# Patient Record
Sex: Female | Born: 1955 | Race: White | Hispanic: No | Smoking: Never smoker
Health system: Southern US, Community
[De-identification: ages and names within clinical notes are randomized; demographics above are authoritative.]

---

## 2016-10-11 DIAGNOSIS — Z23 Encounter for immunization: Secondary | ICD-10-CM | POA: Diagnosis not present

## 2016-11-28 DIAGNOSIS — Z1212 Encounter for screening for malignant neoplasm of rectum: Secondary | ICD-10-CM | POA: Diagnosis not present

## 2016-11-28 DIAGNOSIS — Z1231 Encounter for screening mammogram for malignant neoplasm of breast: Secondary | ICD-10-CM | POA: Diagnosis not present

## 2016-11-28 DIAGNOSIS — Z01419 Encounter for gynecological examination (general) (routine) without abnormal findings: Secondary | ICD-10-CM | POA: Diagnosis not present

## 2017-03-24 DIAGNOSIS — Z13820 Encounter for screening for osteoporosis: Secondary | ICD-10-CM | POA: Diagnosis not present

## 2017-09-11 DIAGNOSIS — Z23 Encounter for immunization: Secondary | ICD-10-CM | POA: Diagnosis not present

## 2018-08-16 ENCOUNTER — Other Ambulatory Visit (HOSPITAL_COMMUNITY)
Admission: RE | Admit: 2018-08-16 | Discharge: 2018-08-16 | Disposition: A | Discharge status: Home / Self Care | Payer: BLUE CROSS/BLUE SHIELD | Source: Ambulatory Visit | Attending: Obstetrics and Gynecology | Admitting: Obstetrics and Gynecology

## 2018-08-16 ENCOUNTER — Encounter: Payer: Self-pay | Admitting: Obstetrics and Gynecology

## 2018-08-16 ENCOUNTER — Ambulatory Visit (INDEPENDENT_AMBULATORY_CARE_PROVIDER_SITE_OTHER): Payer: BLUE CROSS/BLUE SHIELD | Admitting: Obstetrics and Gynecology

## 2018-08-16 ENCOUNTER — Other Ambulatory Visit: Payer: Self-pay

## 2018-08-16 VITALS — BP 112/78 | HR 61 | Ht 64.5 in | Wt 124.0 lb

## 2018-08-16 DIAGNOSIS — Z124 Encounter for screening for malignant neoplasm of cervix: Secondary | ICD-10-CM | POA: Diagnosis not present

## 2018-08-16 DIAGNOSIS — Z01419 Encounter for gynecological examination (general) (routine) without abnormal findings: Secondary | ICD-10-CM

## 2018-08-16 NOTE — Patient Instructions (Addendum)
EXERCISE AND DIET:  We recommended that you start or continue a regular exercise program for good health. Regular exercise means any activity that makes your heart beat faster and makes you sweat.  We recommend exercising at least 30 minutes per day at least 3 days a week, preferably 4 or 5.  We also recommend a diet low in fat and sugar.  Inactivity, poor dietary choices and obesity can cause diabetes, heart attack, stroke, and kidney damage, among others.    ALCOHOL AND SMOKING:  Women should limit their alcohol intake to no more than 7 drinks/beers/glasses of wine (combined, not each!) per week. Moderation of alcohol intake to this level decreases your risk of breast cancer and liver damage. And of course, no recreational drugs are part of a healthy lifestyle.  And absolutely no smoking or even second hand smoke. Most people know smoking can cause heart and lung diseases, but did you know it also contributes to weakening of your bones? Aging of your skin?  Yellowing of your teeth and nails?  CALCIUM AND VITAMIN D:  Adequate intake of calcium and Vitamin D are recommended.  The recommendations for exact amounts of these supplements seem to change often, but generally speaking 600 mg of calcium (either carbonate or citrate) and 800 units of Vitamin D per day seems prudent. Certain women may benefit from higher intake of Vitamin D.  If you are among these women, your doctor will have told you during your visit.    PAP SMEARS:  Pap smears, to check for cervical cancer or precancers,  have traditionally been done yearly, although recent scientific advances have shown that most women can have pap smears less often.  However, every woman still should have a physical exam from her gynecologist every year. It will include a breast check, inspection of the vulva and vagina to check for abnormal growths or skin changes, a visual exam of the cervix, and then an exam to evaluate the size and shape of the uterus and  ovaries.  And after 62 years of age, a rectal exam is indicated to check for rectal cancers. We will also provide age appropriate advice regarding health maintenance, like when you should have certain vaccines, screening for sexually transmitted diseases, bone density testing, colonoscopy, mammograms, etc.   MAMMOGRAMS:  All women over 40 years old should have a yearly mammogram. Many facilities now offer a "3D" mammogram, which may cost around $50 extra out of pocket. If possible,  we recommend you accept the option to have the 3D mammogram performed.  It both reduces the number of women who will be called back for extra views which then turn out to be normal, and it is better than the routine mammogram at detecting truly abnormal areas.    COLONOSCOPY:  Colonoscopy to screen for colon cancer is recommended for all women at age 50.  We know, you hate the idea of the prep.  We agree, BUT, having colon cancer and not knowing it is worse!!  Colon cancer so often starts as a polyp that can be seen and removed at colonscopy, which can quite literally save your life!  And if your first colonoscopy is normal and you have no family history of colon cancer, most women don't have to have it again for 10 years.  Once every ten years, you can do something that may end up saving your life, right?  We will be happy to help you get it scheduled when you are ready.    Be sure to check your insurance coverage so you understand how much it will cost.  It may be covered as a preventative service at no cost, but you should check your particular policy.      Breast Self-Awareness Breast self-awareness means being familiar with how your breasts look and feel. It involves checking your breasts regularly and reporting any changes to your health care provider. Practicing breast self-awareness is important. A change in your breasts can be a sign of a serious medical problem. Being familiar with how your breasts look and feel allows  you to find any problems early, when treatment is more likely to be successful. All women should practice breast self-awareness, including women who have had breast implants. How to do a breast self-exam One way to learn what is normal for your breasts and whether your breasts are changing is to do a breast self-exam. To do a breast self-exam: Look for Changes  1. Remove all the clothing above your waist. 2. Stand in front of a mirror in a room with good lighting. 3. Put your hands on your hips. 4. Push your hands firmly downward. 5. Compare your breasts in the mirror. Look for differences between them (asymmetry), such as: ? Differences in shape. ? Differences in size. ? Puckers, dips, and bumps in one breast and not the other. 6. Look at each breast for changes in your skin, such as: ? Redness. ? Scaly areas. 7. Look for changes in your nipples, such as: ? Discharge. ? Bleeding. ? Dimpling. ? Redness. ? A change in position. Feel for Changes  Carefully feel your breasts for lumps and changes. It is best to do this while lying on your back on the floor and again while sitting or standing in the shower or tub with soapy water on your skin. Feel each breast in the following way:  Place the arm on the side of the breast you are examining above your head.  Feel your breast with the other hand.  Start in the nipple area and make  inch (2 cm) overlapping circles to feel your breast. Use the pads of your three middle fingers to do this. Apply light pressure, then medium pressure, then firm pressure. The light pressure will allow you to feel the tissue closest to the skin. The medium pressure will allow you to feel the tissue that is a little deeper. The firm pressure will allow you to feel the tissue close to the ribs.  Continue the overlapping circles, moving downward over the breast until you feel your ribs below your breast.  Move one finger-width toward the center of the body.  Continue to use the  inch (2 cm) overlapping circles to feel your breast as you move slowly up toward your collarbone.  Continue the up and down exam using all three pressures until you reach your armpit.  Write Down What You Find  Write down what is normal for each breast and any changes that you find. Keep a written record with breast changes or normal findings for each breast. By writing this information down, you do not need to depend only on memory for size, tenderness, or location. Write down where you are in your menstrual cycle, if you are still menstruating. If you are having trouble noticing differences in your breasts, do not get discouraged. With time you will become more familiar with the variations in your breasts and more comfortable with the exam. How often should I examine my breasts? Examine   your breasts every month. If you are breastfeeding, the best time to examine your breasts is after a feeding or after using a breast pump. If you menstruate, the best time to examine your breasts is 5-7 days after your period is over. During your period, your breasts are lumpier, and it may be more difficult to notice changes. When should I see my health care provider? See your health care provider if you notice:  A change in shape or size of your breasts or nipples.  A change in the skin of your breast or nipples, such as a reddened or scaly area.  Unusual discharge from your nipples.  A lump or thick area that was not there before.  Pain in your breasts.  Anything that concerns you.  This information is not intended to replace advice given to you by your health care provider. Make sure you discuss any questions you have with your health care provider. Document Released: 11/28/2005 Document Revised: 05/05/2016 Document Reviewed: 10/18/2015 Elsevier Interactive Patient Education  2018 Elsevier Inc.   Mammogram Facilities  Yearly screening mammograms are recommended for women  beginning at age 40. For a routine screening mammogram, you may schedule the appointment and have it done at the location of your choice.  Please ask the facility to send the results to our office. (fax [phone]) Location options include:  [facility] [address], [address] [place], [place] [address] [phone]  [facility] [address], [address] [place], [place] [address] [phone]  

## 2018-08-16 NOTE — Progress Notes (Signed)
62 y.o. [id number] MarriedCaucasianF here for annual exam.   Period Cycle (Days): (post menopausal). No bleeding. Sexually active, no pain.   No LMP recorded.          Sexually active: Yes.    The current method of family planning is post menopausal status.    Exercising: Yes.    walking Smoker:  no  Health Maintenance: Pap:  2017 History of abnormal Pap:  no MMG:  2 years ago Colonoscopy:  4 years ago, f/u in 10 years BMD:   2 years ago, normal  TDaP:  2015 Gardasil: never   reports that she has never smoked. She has never used smokeless tobacco. She reports that she drinks alcohol. She reports that she does not use drugs. Drinks 1 glass of wine a week. Works as a Charity fundraiser for [employer].  Moved here from [place], [place] in 12/19.  3 kids, one daugther here. Son is in [place]. Other son in [place]. 4 grand children (17-5).   History reviewed. No pertinent past medical history.  History reviewed. No pertinent surgical history.  No current outpatient medications on file.   No current facility-administered medications for this visit.     History reviewed. No pertinent family history. 8 siblings, all healthy other than arthritis.   Review of Systems  Constitutional: Negative.   HENT: Negative.   Eyes: Negative.   Respiratory: Negative.   Cardiovascular: Negative.   Gastrointestinal: Negative.   Endocrine: Negative.   Genitourinary: Negative.   Musculoskeletal: Negative.   Skin: Negative.   Allergic/Immunologic: Negative.   Neurological: Negative.   Hematological: Negative.   Psychiatric/Behavioral: Negative.   All other systems reviewed and are negative.   Exam:   BP 112/78   Pulse 61   Ht 5' 4.5" (1.638 m)   Wt 124 lb (56.2 kg)   BMI 20.96 kg/m   Weight change: @WEIGHTCHANGE @ Height:   Height: 5' 4.5" (163.8 cm)  Ht Readings from Last 3 Encounters:  08/16/18 5' 4.5" (1.638 m)    General appearance: alert, cooperative and appears stated age Head: Normocephalic, without obvious  abnormality, atraumatic Neck: no adenopathy, supple, symmetrical, trachea midline and thyroid normal to inspection and palpation Lungs: clear to auscultation bilaterally Cardiovascular: regular rate and rhythm Breasts: normal appearance, no masses or tenderness Abdomen: soft, non-tender; non distended,  no masses,  no organomegaly Extremities: extremities normal, atraumatic, no cyanosis or edema Skin: Skin color, texture, turgor normal. No rashes or lesions Lymph nodes: Cervical, supraclavicular, and axillary nodes normal. No abnormal inguinal nodes palpated Neurologic: Grossly normal   Pelvic: External genitalia:  no lesions              Urethra:  normal appearing urethra with no masses, tenderness or lesions              Bartholins and Skenes: normal                 Vagina: atrophic appearing vagina with normal color and discharge, no lesions              Cervix: no lesions               Bimanual Exam:  Uterus:  normal size, contour, position, consistency, mobility, non-tender              Adnexa: no mass, fullness, tenderness               Rectovaginal: Confirms  Anus:  normal sphincter tone, no lesions  Chaperone was present for exam.  A:  Well Woman with normal exam  P:   Labs at work  Mammogram # given  Discussed breast self exam  Discussed calcium and vit D intake  Pap with hpv

## 2018-08-16 NOTE — Addendum Note (Signed)
Addended by: Tobi Bastos on: 08/16/2018 02:29 PM   Modules accepted: Orders

## 2018-08-17 LAB — CYTOLOGY - PAP
Diagnosis: NEGATIVE
HPV: NOT DETECTED

## 2018-08-23 ENCOUNTER — Other Ambulatory Visit: Payer: Self-pay | Admitting: Obstetrics and Gynecology

## 2018-08-23 DIAGNOSIS — Z1231 Encounter for screening mammogram for malignant neoplasm of breast: Secondary | ICD-10-CM

## 2018-08-24 ENCOUNTER — Ambulatory Visit
Admission: RE | Admit: 2018-08-24 | Discharge: 2018-08-24 | Disposition: A | Discharge status: Home / Self Care | Payer: BLUE CROSS/BLUE SHIELD | Source: Ambulatory Visit | Attending: Obstetrics and Gynecology | Admitting: Obstetrics and Gynecology

## 2018-08-24 DIAGNOSIS — Z1231 Encounter for screening mammogram for malignant neoplasm of breast: Secondary | ICD-10-CM | POA: Diagnosis not present

## 2018-08-31 DIAGNOSIS — Z23 Encounter for immunization: Secondary | ICD-10-CM | POA: Diagnosis not present

## 2019-08-13 NOTE — Progress Notes (Signed)
63 y.o. G3P2103 Married White or Caucasian Not Hispanic or Latino female here for annual exam.   No vaginal bleeding. No dyspareunia.     No LMP recorded. Patient is postmenopausal.          Sexually active: Yes.    The current method of family planning is post menopausal status.    Exercising: Yes.    running Smoker:  no  Health Maintenance: Pap:  08/16/2018 WNL NEG HPV History of abnormal Pap:  no MMG:  02/27/2018 Birads 1 negative Colonoscopy:  2015 WNL per patient BMD:   2017 WNL per patient TDaP:  Thinks due Gardasil: Never    reports that she has never smoked. She has never used smokeless tobacco. She reports current alcohol use. She reports that she does not use drugs. 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  [place] in 12[place]18.  3 kids, one daugther here. Son is in [place]. Other son in [place]. 4 grand children (18-6). Daughter is pregnant!  History reviewed. No pertinent past medical history.  History reviewed. No pertinent surgical history.  No current outpatient medications on file.   No current facility-administered medications for this visit.     History reviewed. No pertinent family history.  Review of Systems  Constitutional: Negative.   HENT: Negative.   Eyes: Negative.   Respiratory: Negative.   Cardiovascular: Negative.   Gastrointestinal: Negative.   Endocrine: Negative.   Genitourinary: Negative.   Musculoskeletal: Negative.   Skin: Negative.   Allergic/Immunologic: Negative.   Neurological: Negative.   Hematological: Negative.   Psychiatric/Behavioral: Negative.     Exam:   BP 106/70 (BP Location: Right Arm, Patient Position: Sitting, Cuff Size: Normal)   Pulse 64   Temp (!) 97.2 F (36.2 C) (Skin)   Ht 5' 4.57" (1.64 m)   Wt 122 lb 12.8 oz (55.7 kg)   BMI 20.71 kg/m   Weight change: @WEIGHTCHANGE @ Height:   Height: 5' 4.57" (164 cm)  Ht Readings from Last 3 Encounters:  08/21/19 5' 4.57" (1.64 m)  08/16/18 5' 4.5" (1.638 m)    General  appearance: alert, cooperative and appears stated age Head: Normocephalic, without obvious abnormality, atraumatic Neck: no adenopathy, supple, symmetrical, trachea midline and thyroid normal to inspection and palpation Lungs: clear to auscultation bilaterally Cardiovascular: regular rate and rhythm Breasts: normal appearance, no masses or tenderness Abdomen: soft, non-tender; non distended,  no masses,  no organomegaly Extremities: extremities normal, atraumatic, no cyanosis or edema Skin: Skin color, texture, turgor normal. No rashes or lesions Lymph nodes: Cervical, supraclavicular, and axillary nodes normal. No abnormal inguinal nodes palpated Neurologic: Grossly normal   Pelvic: External genitalia:  no lesions              Urethra:  normal appearing urethra with no masses, tenderness or lesions              Bartholins and Skenes: normal                 Vagina: atrophic appearing vagina with normal color and discharge, no lesions              Cervix: no lesions               Bimanual Exam:  Uterus:  normal size, contour, position, consistency, mobility, non-tender              Adnexa: no mass, fullness, tenderness  Rectovaginal: Confirms               Anus:  normal sphincter tone, no lesions  Chaperone was present for exam.  A:  Well Woman with normal exam  P:   No pap this year  Mammogram due  Colonoscopy and DEXA UTD  Discussed breast self exam  Discussed calcium and vit D intake  Screening labs today  TDAP today

## 2019-08-16 ENCOUNTER — Other Ambulatory Visit: Payer: Self-pay

## 2019-08-18 DIAGNOSIS — Z23 Encounter for immunization: Secondary | ICD-10-CM | POA: Diagnosis not present

## 2019-08-21 ENCOUNTER — Other Ambulatory Visit: Payer: Self-pay | Admitting: Obstetrics and Gynecology

## 2019-08-21 ENCOUNTER — Other Ambulatory Visit: Payer: Self-pay

## 2019-08-21 ENCOUNTER — Encounter: Payer: Self-pay | Admitting: Obstetrics and Gynecology

## 2019-08-21 ENCOUNTER — Ambulatory Visit (INDEPENDENT_AMBULATORY_CARE_PROVIDER_SITE_OTHER): Payer: BC Managed Care – PPO | Admitting: Obstetrics and Gynecology

## 2019-08-21 VITALS — BP 106/70 | HR 64 | Temp 97.2°F | Ht 64.57 in | Wt 122.8 lb

## 2019-08-21 DIAGNOSIS — Z1231 Encounter for screening mammogram for malignant neoplasm of breast: Secondary | ICD-10-CM

## 2019-08-21 DIAGNOSIS — Z01419 Encounter for gynecological examination (general) (routine) without abnormal findings: Secondary | ICD-10-CM | POA: Diagnosis not present

## 2019-08-21 DIAGNOSIS — Z0000 Encounter for general adult medical examination without abnormal findings: Secondary | ICD-10-CM

## 2019-08-21 DIAGNOSIS — Z23 Encounter for immunization: Secondary | ICD-10-CM

## 2019-08-21 NOTE — Patient Instructions (Signed)
EXERCISE AND DIET:  We recommended that you start or continue a regular exercise program for good health. Regular exercise means any activity that makes your heart beat faster and makes you sweat.  We recommend exercising at least 30 minutes per day at least 3 days a week, preferably 4 or 5.  We also recommend a diet low in fat and sugar.  Inactivity, poor dietary choices and obesity can cause diabetes, heart attack, stroke, and kidney damage, among others.    ALCOHOL AND SMOKING:  Women should limit their alcohol intake to no more than 7 drinks/beers/glasses of wine (combined, not each!) per week. Moderation of alcohol intake to this level decreases your risk of breast cancer and liver damage. And of course, no recreational drugs are part of a healthy lifestyle.  And absolutely no smoking or even second hand smoke. Most people know smoking can cause heart and lung diseases, but did you know it also contributes to weakening of your bones? Aging of your skin?  Yellowing of your teeth and nails?  CALCIUM AND VITAMIN D:  Adequate intake of calcium and Vitamin D are recommended.  The recommendations for exact amounts of these supplements seem to change often, but generally speaking 1,200 mg of calcium (between diet and supplement) and 800 units of Vitamin D per day seems prudent. Certain women may benefit from higher intake of Vitamin D.  If you are among these women, your doctor will have told you during your visit.    PAP SMEARS:  Pap smears, to check for cervical cancer or precancers,  have traditionally been done yearly, although recent scientific advances have shown that most women can have pap smears less often.  However, every woman still should have a physical exam from her gynecologist every year. It will include a breast check, inspection of the vulva and vagina to check for abnormal growths or skin changes, a visual exam of the cervix, and then an exam to evaluate the size and shape of the uterus and  ovaries.  And after 63 years of age, a rectal exam is indicated to check for rectal cancers. We will also provide age appropriate advice regarding health maintenance, like when you should have certain vaccines, screening for sexually transmitted diseases, bone density testing, colonoscopy, mammograms, etc.   MAMMOGRAMS:  All women over 40 years old should have a yearly mammogram. Many facilities now offer a "3D" mammogram, which may cost around $50 extra out of pocket. If possible,  we recommend you accept the option to have the 3D mammogram performed.  It both reduces the number of women who will be called back for extra views which then turn out to be normal, and it is better than the routine mammogram at detecting truly abnormal areas.    COLON CANCER SCREENING: Now recommend starting at age 45. At this time colonoscopy is not covered for routine screening until 50. There are take home tests that can be done between 45-49.   COLONOSCOPY:  Colonoscopy to screen for colon cancer is recommended for all women at age 50.  We know, you hate the idea of the prep.  We agree, BUT, having colon cancer and not knowing it is worse!!  Colon cancer so often starts as a polyp that can be seen and removed at colonscopy, which can quite literally save your life!  And if your first colonoscopy is normal and you have no family history of colon cancer, most women don't have to have it again for   10 years.  Once every ten years, you can do something that may end up saving your life, right?  We will be happy to help you get it scheduled when you are ready.  Be sure to check your insurance coverage so you understand how much it will cost.  It may be covered as a preventative service at no cost, but you should check your particular policy.      Breast Self-Awareness Breast self-awareness means being familiar with how your breasts look and feel. It involves checking your breasts regularly and reporting any changes to your  health care provider. Practicing breast self-awareness is important. A change in your breasts can be a sign of a serious medical problem. Being familiar with how your breasts look and feel allows you to find any problems early, when treatment is more likely to be successful. All women should practice breast self-awareness, including women who have had breast implants. How to do a breast self-exam One way to learn what is normal for your breasts and whether your breasts are changing is to do a breast self-exam. To do a breast self-exam: Look for Changes  1. Remove all the clothing above your waist. 2. Stand in front of a mirror in a room with good lighting. 3. Put your hands on your hips. 4. Push your hands firmly downward. 5. Compare your breasts in the mirror. Look for differences between them (asymmetry), such as: ? Differences in shape. ? Differences in size. ? Puckers, dips, and bumps in one breast and not the other. 6. Look at each breast for changes in your skin, such as: ? Redness. ? Scaly areas. 7. Look for changes in your nipples, such as: ? Discharge. ? Bleeding. ? Dimpling. ? Redness. ? A change in position. Feel for Changes Carefully feel your breasts for lumps and changes. It is best to do this while lying on your back on the floor and again while sitting or standing in the shower or tub with soapy water on your skin. Feel each breast in the following way:  Place the arm on the side of the breast you are examining above your head.  Feel your breast with the other hand.  Start in the nipple area and make  inch (2 cm) overlapping circles to feel your breast. Use the pads of your three middle fingers to do this. Apply light pressure, then medium pressure, then firm pressure. The light pressure will allow you to feel the tissue closest to the skin. The medium pressure will allow you to feel the tissue that is a little deeper. The firm pressure will allow you to feel the tissue  close to the ribs.  Continue the overlapping circles, moving downward over the breast until you feel your ribs below your breast.  Move one finger-width toward the center of the body. Continue to use the  inch (2 cm) overlapping circles to feel your breast as you move slowly up toward your collarbone.  Continue the up and down exam using all three pressures until you reach your armpit.  Write Down What You Find  Write down what is normal for each breast and any changes that you find. Keep a written record with breast changes or normal findings for each breast. By writing this information down, you do not need to depend only on memory for size, tenderness, or location. Write down where you are in your menstrual cycle, if you are still menstruating. If you are having trouble noticing differences   in your breasts, do not get discouraged. With time you will become more familiar with the variations in your breasts and more comfortable with the exam. How often should I examine my breasts? Examine your breasts every month. If you are breastfeeding, the best time to examine your breasts is after a feeding or after using a breast pump. If you menstruate, the best time to examine your breasts is 5-7 days after your period is over. During your period, your breasts are lumpier, and it may be more difficult to notice changes. When should I see my health care provider? See your health care provider if you notice:  A change in shape or size of your breasts or nipples.  A change in the skin of your breast or nipples, such as a reddened or scaly area.  Unusual discharge from your nipples.  A lump or thick area that was not there before.  Pain in your breasts.  Anything that concerns you.  

## 2019-08-22 LAB — COMPREHENSIVE METABOLIC PANEL
ALT: 12 IU/L (ref 0–32)
AST: 18 IU/L (ref 0–40)
Albumin/Globulin Ratio: 2.2 (ref 1.2–2.2)
Albumin: 4.3 g/dL (ref 3.8–4.8)
Alkaline Phosphatase: 86 IU/L (ref 39–117)
BUN/Creatinine Ratio: 17 (ref 12–28)
BUN: 16 mg/dL (ref 8–27)
Bilirubin Total: 0.8 mg/dL (ref 0.0–1.2)
CO2: 28 mmol/L (ref 20–29)
Calcium: 9.6 mg/dL (ref 8.7–10.3)
Chloride: 99 mmol/L (ref 96–106)
Creatinine, Ser: 0.95 mg/dL (ref 0.57–1.00)
GFR calc Af Amer: 74 mL/min/{1.73_m2} (ref >59)
GFR calc non Af Amer: 64 mL/min/{1.73_m2} (ref >59)
Globulin, Total: 2 g/dL (ref 1.5–4.5)
Glucose: 95 mg/dL (ref 65–99)
Potassium: 5 mmol/L (ref 3.5–5.2)
Sodium: 141 mmol/L (ref 134–144)
Total Protein: 6.3 g/dL (ref 6.0–8.5)

## 2019-08-22 LAB — LIPID PANEL
Chol/HDL Ratio: 3.7 ratio (ref 0.0–4.4)
Cholesterol, Total: 230 mg/dL — ABNORMAL HIGH (ref 100–199)
HDL: 62 mg/dL (ref >39)
LDL Chol Calc (NIH): 149 mg/dL — ABNORMAL HIGH (ref 0–99)
Triglycerides: 107 mg/dL (ref 0–149)
VLDL Cholesterol Cal: 19 mg/dL (ref 5–40)

## 2019-08-22 LAB — CBC
Hematocrit: 44.8 % (ref 34.0–46.6)
Hemoglobin: 14 g/dL (ref 11.1–15.9)
MCH: 30.4 pg (ref 26.6–33.0)
MCHC: 31.3 g/dL — ABNORMAL LOW (ref 31.5–35.7)
MCV: 97 fL (ref 79–97)
Platelets: 265 10*3/uL (ref 150–450)
RBC: 4.6 x10E6/uL (ref 3.77–5.28)
RDW: 13 % (ref 11.7–15.4)
WBC: 4.4 10*3/uL (ref 3.4–10.8)

## 2019-10-03 ENCOUNTER — Ambulatory Visit
Admission: RE | Admit: 2019-10-03 | Discharge: 2019-10-03 | Disposition: A | Discharge status: Home / Self Care | Payer: BC Managed Care – PPO | Source: Ambulatory Visit | Attending: Obstetrics and Gynecology | Admitting: Obstetrics and Gynecology

## 2019-10-03 ENCOUNTER — Other Ambulatory Visit: Payer: Self-pay

## 2019-10-03 DIAGNOSIS — Z1231 Encounter for screening mammogram for malignant neoplasm of breast: Secondary | ICD-10-CM | POA: Diagnosis not present

## 2019-10-28 IMAGING — MG DIGITAL SCREENING BILATERAL MAMMOGRAM WITH CAD
4 series · 4 of 4 positions shown · non-contrast
Comparison: Previous exam(s).

CLINICAL DATA: Screening.

EXAM:
DIGITAL SCREENING BILATERAL MAMMOGRAM WITH CAD

[R MLO]
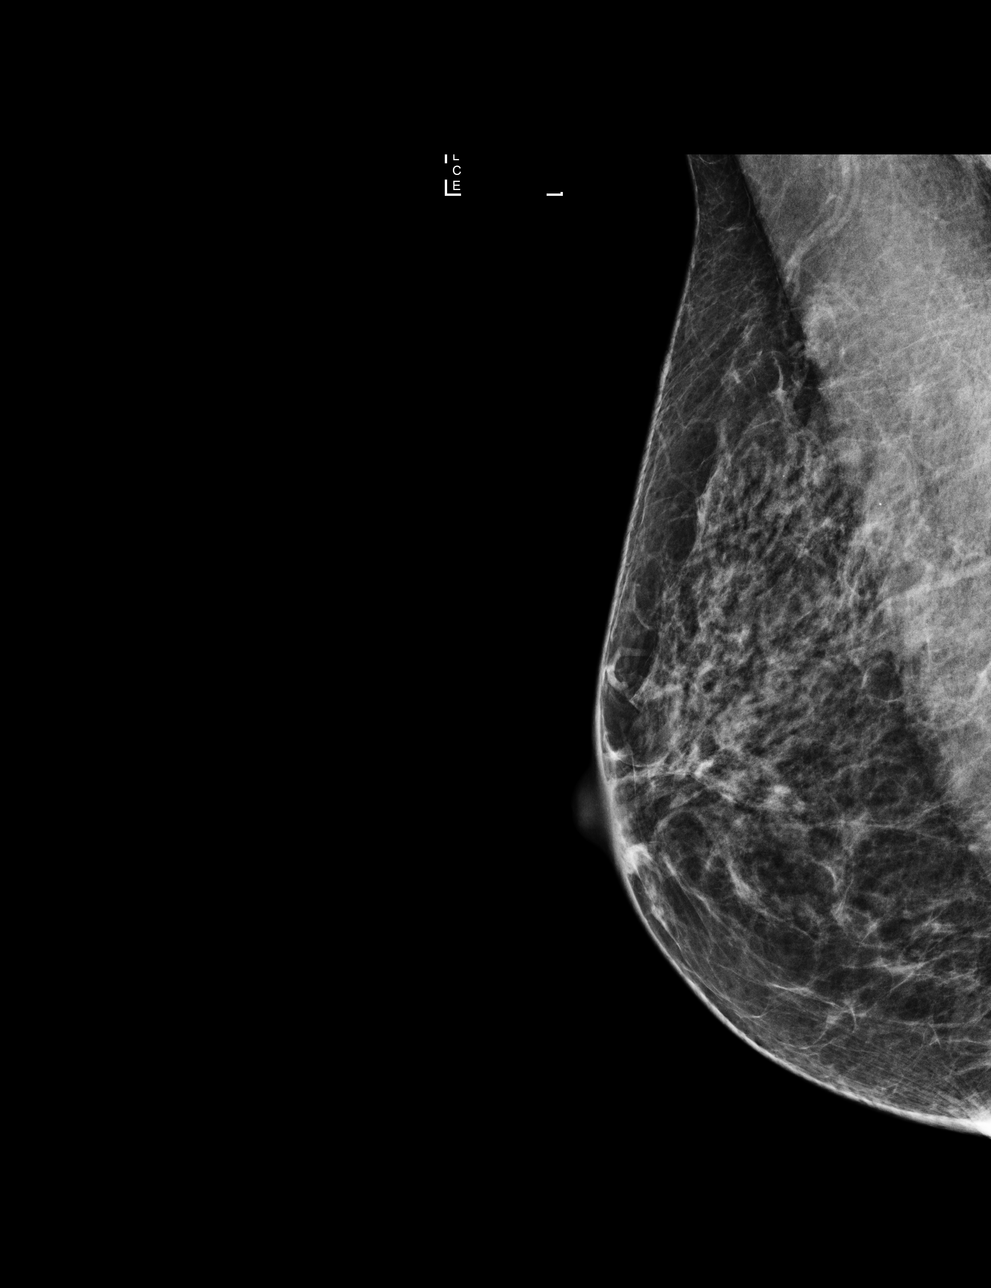

[R CC]
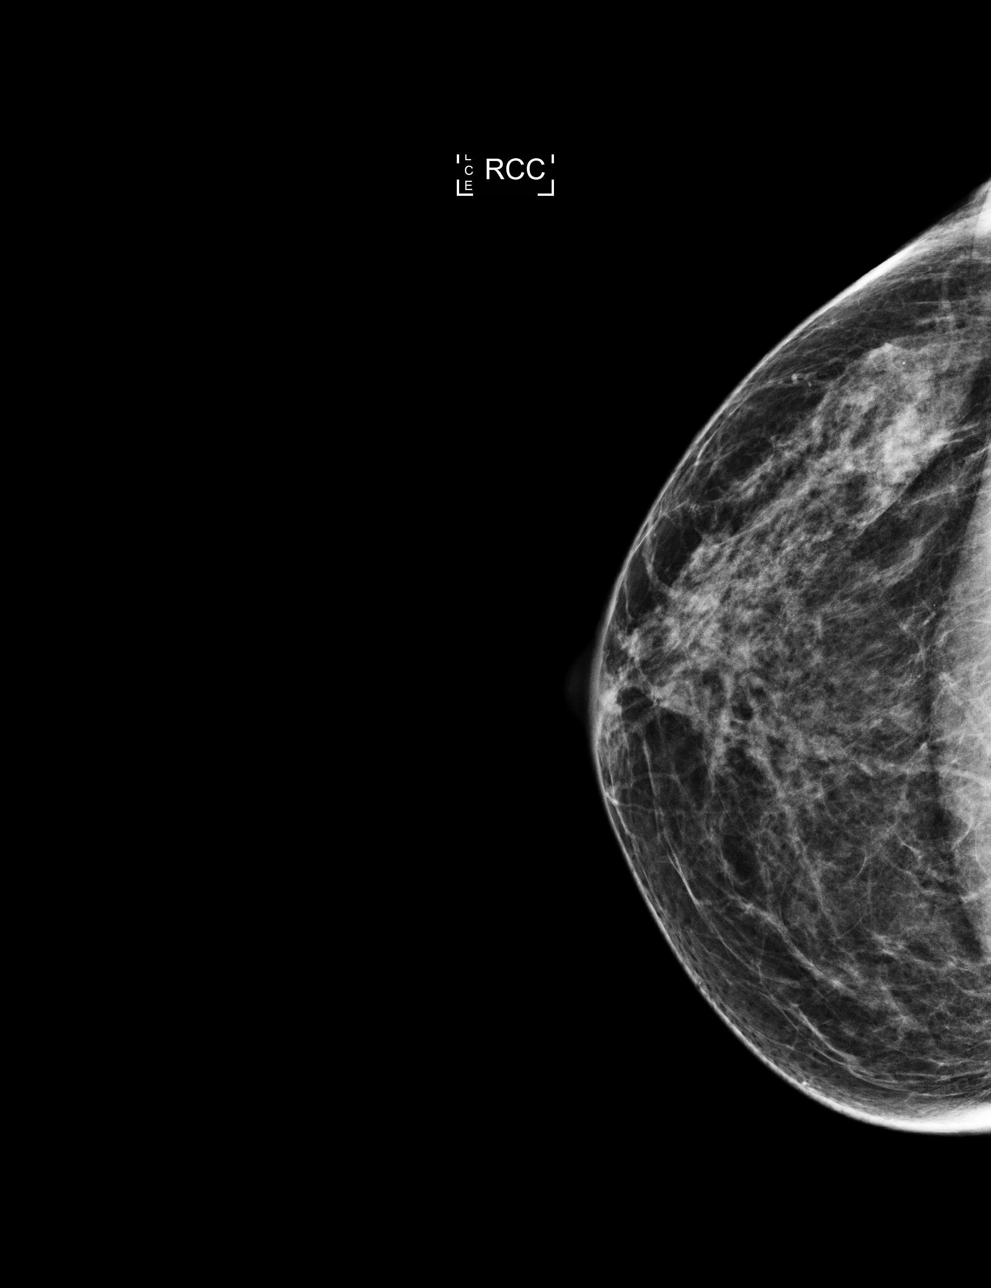

[L CC]
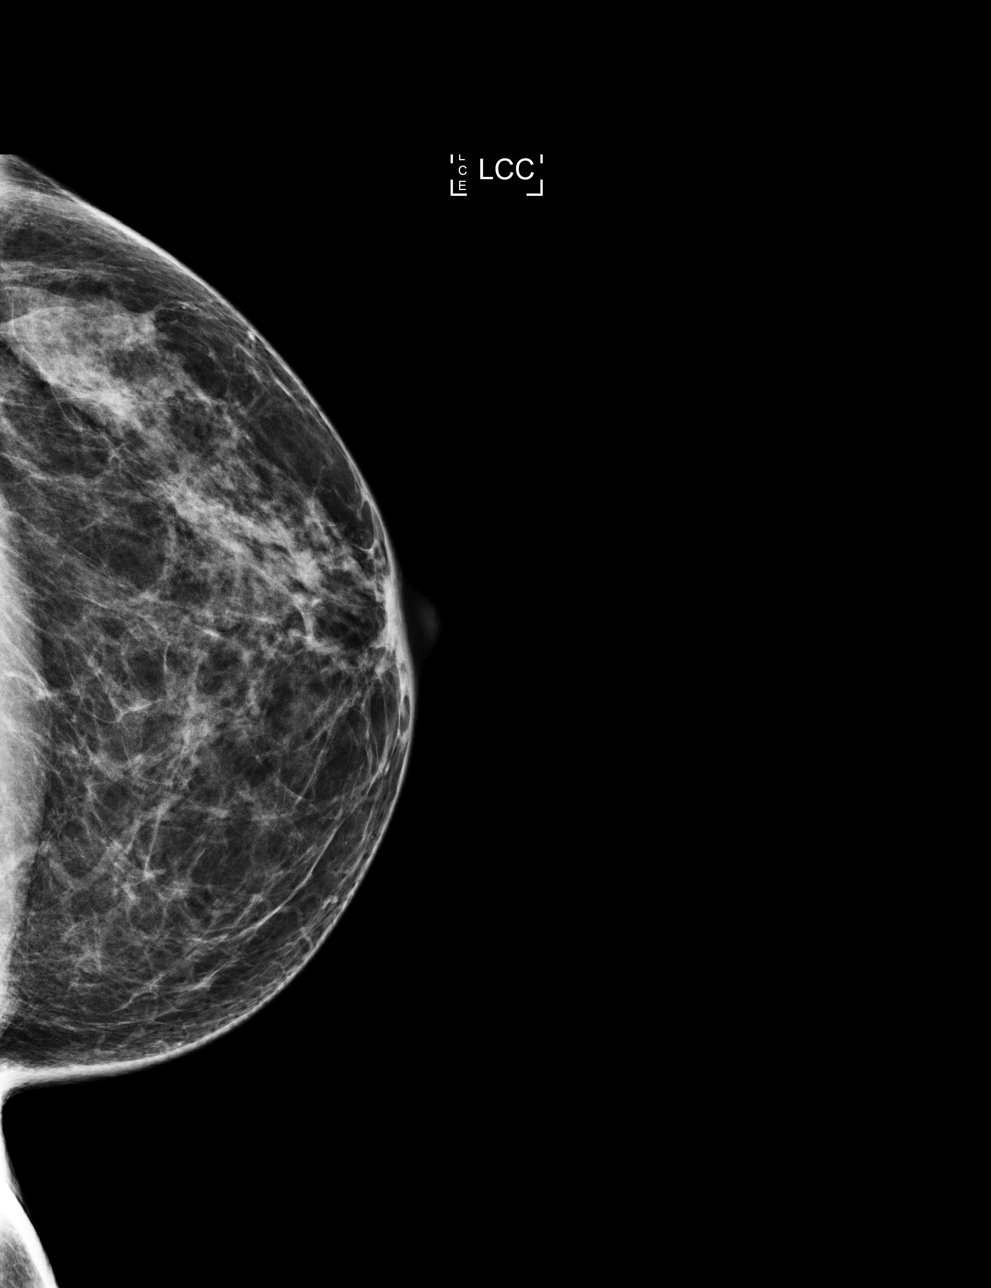

[L MLO]
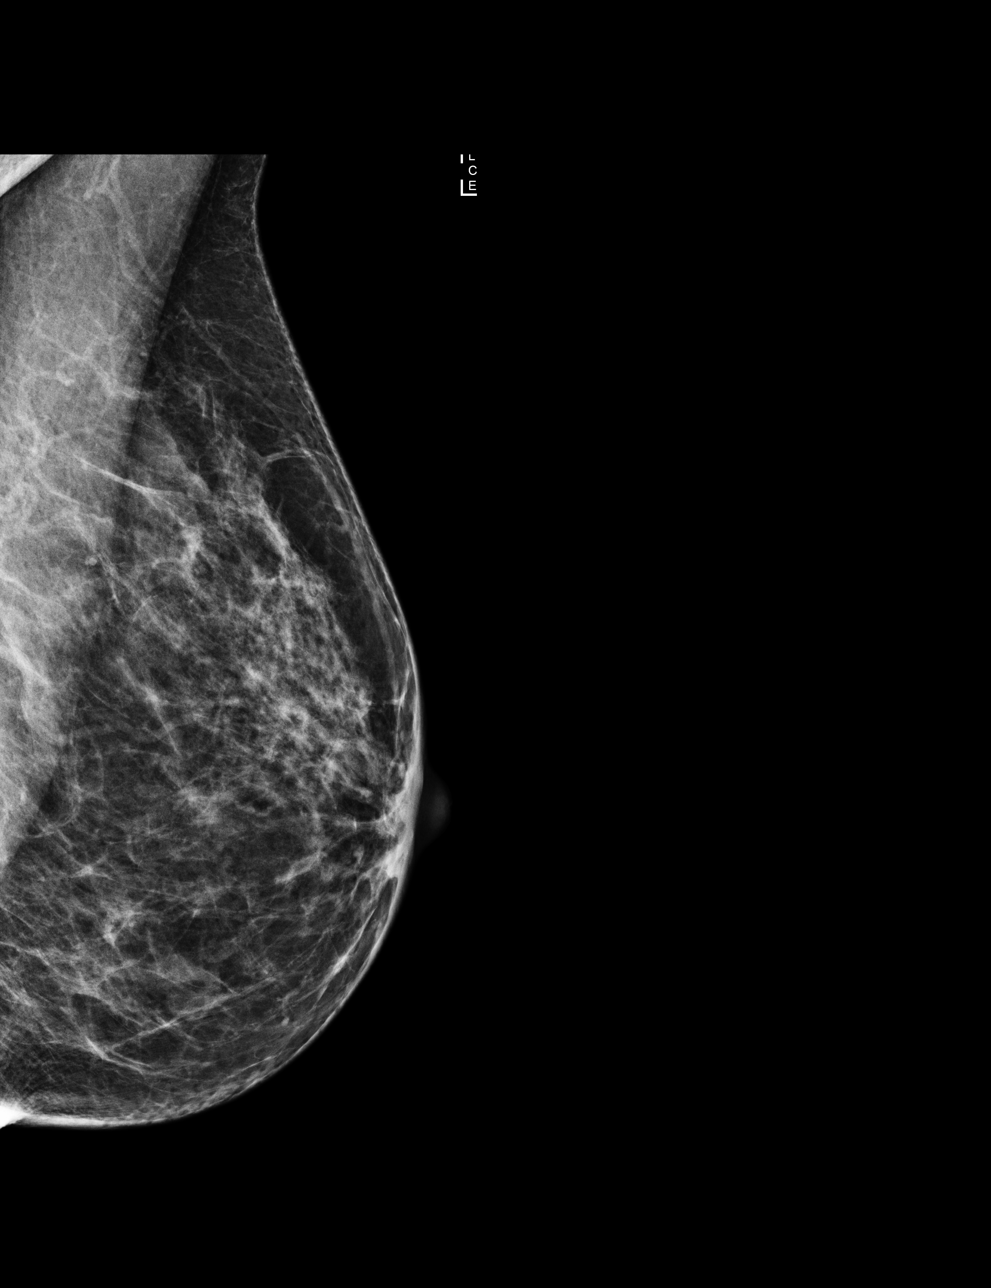

[4 of 4 positions shown; findings below may reference images not displayed]

ACR Breast Density Category b: There are scattered areas of
fibroglandular density.
FINDINGS: There are no findings suspicious for malignancy. Images were
processed with CAD.
IMPRESSION: No mammographic evidence of malignancy. A result letter of this
screening mammogram will be mailed directly to the patient.

RECOMMENDATION:
Screening mammogram in one year. (Code:AS-G-LCT)

BI-RADS CATEGORY  1: Negative.

## 2020-02-24 ENCOUNTER — Ambulatory Visit: Payer: BC Managed Care – PPO | Attending: Internal Medicine

## 2020-02-24 DIAGNOSIS — Z23 Encounter for immunization: Secondary | ICD-10-CM

## 2020-02-24 NOTE — Progress Notes (Signed)
   Covid-19 Vaccination Clinic  Name:  Doralee Kocak    MRN: [id number] DOB: Feb 03, 1956  02/24/2020  Ms. Dagley was observed post Covid-19 immunization for 15 minutes without incident. She was provided with Vaccine Information Sheet and instruction to access the V-Safe system.   Ms. Raczynski was instructed to call 911 with any severe reactions post vaccine: Marland Kitchen Difficulty breathing  . Swelling of face and throat  . A fast heartbeat  . A bad rash all over body  . Dizziness and weakness   Immunizations Administered    Name Date Dose VIS Date Route   Pfizer COVID-19 Vaccine 02/24/2020  5:14 PM 0.3 mL 11/22/2019 Intramuscular   Manufacturer: [employer], [employer]   Lot: [id number]   NDC: [address]-7

## 2020-06-02 ENCOUNTER — Telehealth: Payer: Self-pay | Admitting: *Deleted

## 2020-06-02 NOTE — Telephone Encounter (Signed)
UPDATED and NEW Health Care Power of Attorney and HIPAA documents provided and sent to be processed to [facility].

## 2020-08-25 NOTE — Progress Notes (Signed)
64 y.o. G3P2103 Married White or Caucasian Not Hispanic or Latino female here for annual exam.  No vaginal bleeding.   Husband has stage 4 lung cancer, diagnosed last year. He has a terrible cough, his energy is okay. He was not a smoker. He is 81 and retired.   She isn't depressed, has lots of support.     No LMP recorded. Patient is postmenopausal.          Sexually active: No.  The current method of family planning is abstinence.    Exercising: Yes.    Walking  Smoker:  no  Health Maintenance: Pap:  08/16/2018 WNL NEG HPV History of abnormal Pap:  no MMG:  10/04/19 Density C Bi-rads 1 neg  BMD:   2017 per patient  Colonoscopy: 2015 normal per patient  TDaP:  08/21/19  Gardasil: NA   reports that she has never smoked. She has never used smokeless tobacco. She reports current alcohol use. She reports that she does not use drugs. 1 drink a week. Works as a Charity fundraiser for [employer].  Moved here from [place], [place] in 12/18.  3 kids, one daugther here with the baby is here. Son is in [place]. Other son in [place]. 5 grand children, 6 months to 19 years.    No past medical history on file.  No past surgical history on file.  No current outpatient medications on file.   No current facility-administered medications for this visit.    No family history on file.  Review of Systems  All other systems reviewed and are negative.   Exam:   BP 122/68   Pulse 70   Ht 5' 4.5" (1.638 m)   Wt 118 lb 6.4 oz (53.7 kg)   SpO2 98%   BMI 20.01 kg/m   Weight change: @WEIGHTCHANGE @ Height:   Height: 5' 4.5" (163.8 cm)  Ht Readings from Last 3 Encounters:  08/26/20 5' 4.5" (1.638 m)  08/21/19 5' 4.57" (1.64 m)  08/16/18 5' 4.5" (1.638 m)    General appearance: alert, cooperative and appears stated age Head: Normocephalic, without obvious abnormality, atraumatic Neck: no adenopathy, supple, symmetrical, trachea midline and thyroid normal to inspection and palpation Lungs: clear to auscultation  bilaterally Cardiovascular: regular rate and rhythm Breasts: normal appearance, no masses or tenderness Abdomen: soft, non-tender; non distended,  no masses,  no organomegaly Extremities: extremities normal, atraumatic, no cyanosis or edema Skin: Skin color, texture, turgor normal. No rashes or lesions Lymph nodes: Cervical, supraclavicular, and axillary nodes normal. No abnormal inguinal nodes palpated Neurologic: Grossly normal   Pelvic: External genitalia:  no lesions              Urethra:  normal appearing urethra with no masses, tenderness or lesions              Bartholins and Skenes: normal                 Vagina: atrophic appearing vagina with normal color and discharge, no lesions              Cervix: no lesions               Bimanual Exam:  Uterus:  normal size, contour, position, consistency, mobility, non-tender              Adnexa: no mass, fullness, tenderness               Rectovaginal: Confirms  Anus:  normal sphincter tone, no lesions  Carolynn Serve chaperoned for the exam.  A:  Well Woman with normal exam  P:   No pap this year  Mammogram next month  Colonoscopy UTD  Discussed breast self exam  Discussed calcium and vit D intake  Screening labs

## 2020-08-26 ENCOUNTER — Encounter: Payer: Self-pay | Admitting: Obstetrics and Gynecology

## 2020-08-26 ENCOUNTER — Ambulatory Visit (INDEPENDENT_AMBULATORY_CARE_PROVIDER_SITE_OTHER): Payer: BC Managed Care – PPO | Admitting: Obstetrics and Gynecology

## 2020-08-26 ENCOUNTER — Other Ambulatory Visit: Payer: Self-pay

## 2020-08-26 VITALS — BP 122/68 | HR 70 | Ht 64.5 in | Wt 118.4 lb

## 2020-08-26 DIAGNOSIS — Z0000 Encounter for general adult medical examination without abnormal findings: Secondary | ICD-10-CM

## 2020-08-26 DIAGNOSIS — Z01419 Encounter for gynecological examination (general) (routine) without abnormal findings: Secondary | ICD-10-CM | POA: Diagnosis not present

## 2020-08-27 LAB — COMPREHENSIVE METABOLIC PANEL
ALT: 34 IU/L — ABNORMAL HIGH (ref 0–32)
AST: 34 IU/L (ref 0–40)
Albumin/Globulin Ratio: 2.2 (ref 1.2–2.2)
Albumin: 4.6 g/dL (ref 3.8–4.8)
Alkaline Phosphatase: 84 IU/L (ref 44–121)
BUN/Creatinine Ratio: 12 (ref 12–28)
BUN: 12 mg/dL (ref 8–27)
Bilirubin Total: 0.5 mg/dL (ref 0.0–1.2)
CO2: 27 mmol/L (ref 20–29)
Calcium: 9.3 mg/dL (ref 8.7–10.3)
Chloride: 99 mmol/L (ref 96–106)
Creatinine, Ser: 0.99 mg/dL (ref 0.57–1.00)
GFR calc Af Amer: 70 mL/min/{1.73_m2} (ref >59)
GFR calc non Af Amer: 61 mL/min/{1.73_m2} (ref >59)
Globulin, Total: 2.1 g/dL (ref 1.5–4.5)
Glucose: 89 mg/dL (ref 65–99)
Potassium: 4 mmol/L (ref 3.5–5.2)
Sodium: 141 mmol/L (ref 134–144)
Total Protein: 6.7 g/dL (ref 6.0–8.5)

## 2020-08-27 LAB — LIPID PANEL
Chol/HDL Ratio: 4.1 ratio (ref 0.0–4.4)
Cholesterol, Total: 208 mg/dL — ABNORMAL HIGH (ref 100–199)
HDL: 51 mg/dL (ref >39)
LDL Chol Calc (NIH): 130 mg/dL — ABNORMAL HIGH (ref 0–99)
Triglycerides: 151 mg/dL — ABNORMAL HIGH (ref 0–149)
VLDL Cholesterol Cal: 27 mg/dL (ref 5–40)

## 2020-08-27 LAB — CBC
Hematocrit: 41 % (ref 34.0–46.6)
Hemoglobin: 14.1 g/dL (ref 11.1–15.9)
MCH: 33.3 pg — ABNORMAL HIGH (ref 26.6–33.0)
MCHC: 34.4 g/dL (ref 31.5–35.7)
MCV: 97 fL (ref 79–97)
Platelets: 260 10*3/uL (ref 150–450)
RBC: 4.23 x10E6/uL (ref 3.77–5.28)
RDW: 13 % (ref 11.7–15.4)
WBC: 5.5 10*3/uL (ref 3.4–10.8)

## 2020-08-28 ENCOUNTER — Other Ambulatory Visit: Payer: Self-pay | Admitting: *Deleted

## 2020-08-28 DIAGNOSIS — R899 Unspecified abnormal finding in specimens from other organs, systems and tissues: Secondary | ICD-10-CM

## 2020-08-31 ENCOUNTER — Other Ambulatory Visit: Payer: Self-pay | Admitting: Obstetrics and Gynecology

## 2020-08-31 DIAGNOSIS — Z1231 Encounter for screening mammogram for malignant neoplasm of breast: Secondary | ICD-10-CM

## 2020-10-07 ENCOUNTER — Ambulatory Visit
Admission: RE | Admit: 2020-10-07 | Discharge: 2020-10-07 | Disposition: A | Discharge status: Home / Self Care | Payer: BC Managed Care – PPO | Source: Ambulatory Visit | Attending: Obstetrics and Gynecology | Admitting: Obstetrics and Gynecology

## 2020-10-07 ENCOUNTER — Other Ambulatory Visit: Payer: Self-pay | Admitting: Obstetrics and Gynecology

## 2020-10-07 ENCOUNTER — Other Ambulatory Visit: Payer: Self-pay

## 2020-10-07 DIAGNOSIS — Z1231 Encounter for screening mammogram for malignant neoplasm of breast: Secondary | ICD-10-CM

## 2020-11-23 ENCOUNTER — Other Ambulatory Visit: Payer: BC Managed Care – PPO

## 2020-11-30 ENCOUNTER — Other Ambulatory Visit: Payer: Self-pay

## 2020-11-30 ENCOUNTER — Other Ambulatory Visit (INDEPENDENT_AMBULATORY_CARE_PROVIDER_SITE_OTHER): Payer: BC Managed Care – PPO

## 2020-11-30 DIAGNOSIS — R899 Unspecified abnormal finding in specimens from other organs, systems and tissues: Secondary | ICD-10-CM | POA: Diagnosis not present

## 2020-12-01 LAB — LIPID PANEL
Chol/HDL Ratio: 3 ratio (ref 0.0–4.4)
Cholesterol, Total: 186 mg/dL (ref 100–199)
HDL: 62 mg/dL (ref >39)
LDL Chol Calc (NIH): 106 mg/dL — ABNORMAL HIGH (ref 0–99)
Triglycerides: 101 mg/dL (ref 0–149)
VLDL Cholesterol Cal: 18 mg/dL (ref 5–40)

## 2020-12-01 LAB — HEPATIC FUNCTION PANEL
ALT: 24 IU/L (ref 0–32)
AST: 26 IU/L (ref 0–40)
Albumin: 4.3 g/dL (ref 3.8–4.8)
Alkaline Phosphatase: 85 IU/L (ref 44–121)
Bilirubin Total: 0.5 mg/dL (ref 0.0–1.2)
Bilirubin, Direct: 0.13 mg/dL (ref 0.00–0.40)
Total Protein: 6.4 g/dL (ref 6.0–8.5)

## 2021-09-01 ENCOUNTER — Ambulatory Visit: Payer: BC Managed Care – PPO | Admitting: Obstetrics and Gynecology

## 2021-09-03 ENCOUNTER — Other Ambulatory Visit: Payer: Self-pay | Admitting: Obstetrics and Gynecology

## 2021-09-03 DIAGNOSIS — Z1231 Encounter for screening mammogram for malignant neoplasm of breast: Secondary | ICD-10-CM

## 2021-09-22 DIAGNOSIS — L814 Other melanin hyperpigmentation: Secondary | ICD-10-CM | POA: Diagnosis not present

## 2021-09-22 DIAGNOSIS — L570 Actinic keratosis: Secondary | ICD-10-CM | POA: Diagnosis not present

## 2021-09-22 DIAGNOSIS — D1801 Hemangioma of skin and subcutaneous tissue: Secondary | ICD-10-CM | POA: Diagnosis not present

## 2021-09-22 DIAGNOSIS — L821 Other seborrheic keratosis: Secondary | ICD-10-CM | POA: Diagnosis not present

## 2021-09-22 DIAGNOSIS — L578 Other skin changes due to chronic exposure to nonionizing radiation: Secondary | ICD-10-CM | POA: Diagnosis not present

## 2021-10-07 NOTE — Progress Notes (Signed)
65 y.o. G3P2103 Married White or Caucasian Not Hispanic or Latino female here for annual exam.  Retired 2 weeks ago. Husband has stage 4 lung cancer, diagnosed 2 years ago. He is on his 7th round of different treatment.    No vaginal bleeding. No bowel or bladder c/o.   No LMP recorded. Patient is postmenopausal.          Sexually active: No.  The current method of family planning is post menopausal status.    Exercising: Yes.     Running and walking light weights   Smoker:  no  Health Maintenance: Pap:  08/16/2018 WNL NEG HPV History of abnormal Pap:  no MMG:  10/07/20 Birads 1 neg , scheduled on Monday.  BMD:   none  Colonoscopy: 2017 per patient, f/u in 10 years.   TDaP:  08/21/19  Gardasil: n/a   reports that she has never smoked. She has never used smokeless tobacco. She reports current alcohol use. She reports that she does not use drugs. Just occasional ETOH. Just retired, was a Charity fundraiser for [employer]. 3 kids, one daugther (lives here).  Son is in [place]. Other son in [place]. 5 grand children, 53.91 to 74 years old. Only the baby lives locally, the others are in [place].   History reviewed. No pertinent past medical history.  History reviewed. No pertinent surgical history.  Current Outpatient Medications  Medication Sig Dispense Refill   [facility] SUNSCREEN SPF 30 EX apply     No current facility-administered medications for this visit.    History reviewed. No pertinent family history.  Review of Systems  All other systems reviewed and are negative.  Exam:   BP 120/72   Pulse 72   Ht 5\' 4"  (1.626 m)   Wt 116 lb (52.6 kg)   SpO2 99%   BMI 19.91 kg/m   Weight change: @WEIGHTCHANGE @ Height:   Height: 5\' 4"  (162.6 cm)  Ht Readings from Last 3 Encounters:  10/08/21 5\' 4"  (1.626 m)  08/26/20 5' 4.5" (1.638 m)  08/21/19 5' 4.57" (1.64 m)    General appearance: alert, cooperative and appears stated age Head: Normocephalic, without obvious abnormality, atraumatic Neck: no adenopathy, supple,  symmetrical, trachea midline and thyroid normal to inspection and palpation Lungs: clear to auscultation bilaterally Cardiovascular: regular rate and rhythm Breasts: normal appearance, no masses or tenderness Abdomen: soft, non-tender; non distended,  no masses,  no organomegaly Extremities: extremities normal, atraumatic, no cyanosis or edema Skin: Skin color, texture, turgor normal. No rashes or lesions Lymph nodes: Cervical, supraclavicular, and axillary nodes normal. No abnormal inguinal nodes palpated Neurologic: Grossly normal   Pelvic: External genitalia:  no lesions              Urethra:  normal appearing urethra with no masses, tenderness or lesions              Bartholins and Skenes: normal                 Vagina: atrophic appearing vagina with normal color and discharge, no lesions              Cervix: no lesions               Bimanual Exam:  Uterus:  normal size, contour, position, consistency, mobility, non-tender              Adnexa: no mass, fullness, tenderness               Rectovaginal: Confirms  Anus:  normal sphincter tone, no lesions  Carolynn Serve chaperoned for the exam.  1. Well woman exam Discussed breast self exam Discussed calcium and vit D intake No pap this year Mammogram next week Colonoscopy is UTD  2. Laboratory exam ordered as part of routine general medical examination - CBC - Comprehensive metabolic panel - Lipid panel

## 2021-10-08 ENCOUNTER — Encounter: Payer: Self-pay | Admitting: Obstetrics and Gynecology

## 2021-10-08 ENCOUNTER — Other Ambulatory Visit: Payer: Self-pay

## 2021-10-08 ENCOUNTER — Ambulatory Visit (INDEPENDENT_AMBULATORY_CARE_PROVIDER_SITE_OTHER): Payer: BC Managed Care – PPO | Admitting: Obstetrics and Gynecology

## 2021-10-08 VITALS — BP 120/72 | HR 72 | Ht 64.0 in | Wt 116.0 lb

## 2021-10-08 DIAGNOSIS — Z01419 Encounter for gynecological examination (general) (routine) without abnormal findings: Secondary | ICD-10-CM | POA: Diagnosis not present

## 2021-10-08 DIAGNOSIS — Q8789 Other specified congenital malformation syndromes, not elsewhere classified: Secondary | ICD-10-CM | POA: Insufficient documentation

## 2021-10-08 DIAGNOSIS — Z0000 Encounter for general adult medical examination without abnormal findings: Secondary | ICD-10-CM | POA: Diagnosis not present

## 2021-10-08 LAB — COMPREHENSIVE METABOLIC PANEL
AG Ratio: 2 (calc) (ref 1.0–2.5)
ALT: 41 U/L — ABNORMAL HIGH (ref 6–29)
AST: 42 U/L — ABNORMAL HIGH (ref 10–35)
Albumin: 4.5 g/dL (ref 3.6–5.1)
Alkaline phosphatase (APISO): 78 U/L (ref 37–153)
BUN: 15 mg/dL (ref 7–25)
CO2: 29 mmol/L (ref 20–32)
Calcium: 9.6 mg/dL (ref 8.6–10.4)
Chloride: 103 mmol/L (ref 98–110)
Creat: 0.88 mg/dL (ref 0.50–1.05)
Globulin: 2.3 g/dL (calc) (ref 1.9–3.7)
Glucose, Bld: 72 mg/dL (ref 65–99)
Potassium: 4.5 mmol/L (ref 3.5–5.3)
Sodium: 140 mmol/L (ref 135–146)
Total Bilirubin: 0.8 mg/dL (ref 0.2–1.2)
Total Protein: 6.8 g/dL (ref 6.1–8.1)

## 2021-10-08 LAB — LIPID PANEL
Cholesterol: 243 mg/dL — ABNORMAL HIGH (ref <200)
HDL: 62 mg/dL (ref >=50)
LDL Cholesterol (Calc): 160 mg/dL (calc) — ABNORMAL HIGH
Non-HDL Cholesterol (Calc): 181 mg/dL (calc) — ABNORMAL HIGH (ref <130)
Total CHOL/HDL Ratio: 3.9 (calc) (ref <5.0)
Triglycerides: 101 mg/dL (ref <150)

## 2021-10-08 LAB — CBC
HCT: 43 % (ref 35.0–45.0)
Hemoglobin: 14.5 g/dL (ref 11.7–15.5)
MCH: 31.6 pg (ref 27.0–33.0)
MCHC: 33.7 g/dL (ref 32.0–36.0)
MCV: 93.7 fL (ref 80.0–100.0)
MPV: 10.2 fL (ref 7.5–12.5)
Platelets: 263 10*3/uL (ref 140–400)
RBC: 4.59 10*6/uL (ref 3.80–5.10)
RDW: 13.1 % (ref 11.0–15.0)
WBC: 3.6 10*3/uL — ABNORMAL LOW (ref 3.8–10.8)

## 2021-10-08 NOTE — Patient Instructions (Signed)

## 2021-10-11 ENCOUNTER — Ambulatory Visit
Admission: RE | Admit: 2021-10-11 | Discharge: 2021-10-11 | Disposition: A | Discharge status: Home / Self Care | Payer: BC Managed Care – PPO | Source: Ambulatory Visit | Attending: Obstetrics and Gynecology | Admitting: Obstetrics and Gynecology

## 2021-10-11 ENCOUNTER — Other Ambulatory Visit: Payer: Self-pay | Admitting: Obstetrics and Gynecology

## 2021-10-11 ENCOUNTER — Other Ambulatory Visit: Payer: Self-pay

## 2021-10-11 DIAGNOSIS — Z1231 Encounter for screening mammogram for malignant neoplasm of breast: Secondary | ICD-10-CM

## 2022-04-04 ENCOUNTER — Encounter: Payer: Self-pay | Admitting: Nurse Practitioner

## 2022-04-04 ENCOUNTER — Ambulatory Visit (INDEPENDENT_AMBULATORY_CARE_PROVIDER_SITE_OTHER): Payer: Medicare Other | Admitting: Nurse Practitioner

## 2022-04-04 VITALS — BP 123/92 | HR 67 | Temp 97.2°F | Ht 64.5 in | Wt 117.2 lb

## 2022-04-04 DIAGNOSIS — Z0000 Encounter for general adult medical examination without abnormal findings: Secondary | ICD-10-CM | POA: Insufficient documentation

## 2022-04-04 DIAGNOSIS — E7879 Other disorders of bile acid and cholesterol metabolism: Secondary | ICD-10-CM | POA: Diagnosis not present

## 2022-04-04 LAB — POCT GLYCOSYLATED HEMOGLOBIN (HGB A1C)
HbA1c POC (<> result, manual entry): 5.1 % (ref 4.0–5.6)
HbA1c, POC (controlled diabetic range): 5.1 % (ref 0.0–7.0)
HbA1c, POC (prediabetic range): 5.1 % — AB (ref 5.7–6.4)
Hemoglobin A1C: 5.1 % (ref 4.0–5.6)

## 2022-04-04 LAB — GLUCOSE, POCT (MANUAL RESULT ENTRY): POC Glucose: 87 mg/dl (ref 70–99)

## 2022-04-04 LAB — POCT URINALYSIS DIP (CLINITEK)
Bilirubin, UA: NEGATIVE
Blood, UA: NEGATIVE
Glucose, UA: NEGATIVE mg/dL
Ketones, POC UA: NEGATIVE mg/dL
Leukocytes, UA: NEGATIVE
Nitrite, UA: NEGATIVE
POC PROTEIN,UA: NEGATIVE
Spec Grav, UA: 1.02 (ref 1.010–1.025)
Urobilinogen, UA: 0.2 E.U./dL
pH, UA: 8.5 — AB (ref 5.0–8.0)

## 2022-04-04 NOTE — Progress Notes (Signed)
@Patient  ID: , female    DOB: 17-Sep-1956, 66 y.o.   MRN: [id number] ? ?Chief Complaint  ?Patient presents with  ? Establish Care  ?  Patient is here today to establish care and get labs done. Patient has no major concerns to discuss.  ? ? ?Referring provider: ?No ref. provider found ? ? ?HPI ? ?Patient presents today to establish care.  She has not had a primary care physician.  She is followed by OB/GYN for routine visits and they have been checking blood work for her as well.  She was noted to have elevated cholesterol and they advised her to get established with a primary care physician.  Her last lipid panel was back in October.  Patient has no new issues or concerns today.  Patient does eat a healthy diet and exercises daily.  Patient is at a healthy weight.  Denies f/c/s, n/v/d, hemoptysis, PND, chest pain or edema. ? ? ? ?No Known Allergies ? ?Immunization History  ?Administered Date(s) Administered  ? Influenza,inj,Quad PF,6+ Mos 08/18/2019  ? PFIZER(Purple Top)SARS-COV-2 Vaccination 02/24/2020  ? Tdap 12/12/2013, 08/21/2019  ? ? ?History reviewed. No pertinent past medical history. ? ?Tobacco History: ?Social History  ? ?Tobacco Use  ?Smoking Status Never  ?Smokeless Tobacco Never  ? ?Counseling given: Not Answered ? ? ?Outpatient Encounter Medications as of 04/04/2022  ?Medication Sig  ? [DISCONTINUED] [facility] SUNSCREEN SPF 30 EX apply (Patient not taking: Reported on 04/04/2022)  ? ?No facility-administered encounter medications on file as of 04/04/2022.  ? ? ? ?Review of Systems ? ?Review of Systems  ?Constitutional: Negative.   ?HENT: Negative.    ?Cardiovascular: Negative.   ?Gastrointestinal: Negative.   ?Allergic/Immunologic: Negative.   ?Neurological: Negative.   ?Psychiatric/Behavioral: Negative.     ? ? ? ?Physical Exam ? ?BP (!) 123/92   Pulse 67   Temp (!) 97.2 ?F (36.2 ?C)   Ht 5' 4.5" (1.638 m)   Wt 117 lb 3.2 oz (53.2 kg)   SpO2 100%   BMI 19.81 kg/m?  ? ?Wt Readings from Last 5  Encounters:  ?04/04/22 117 lb 3.2 oz (53.2 kg)  ?10/08/21 116 lb (52.6 kg)  ?08/26/20 118 lb 6.4 oz (53.7 kg)  ?08/21/19 122 lb 12.8 oz (55.7 kg)  ?08/16/18 124 lb (56.2 kg)  ? ? ? ?Physical Exam ?Vitals and nursing note reviewed.  ?Constitutional:   ?   General: She is not in acute distress. ?   Appearance: She is well-developed.  ?Cardiovascular:  ?   Rate and Rhythm: Normal rate and regular rhythm.  ?Pulmonary:  ?   Effort: Pulmonary effort is normal.  ?   Breath sounds: Normal breath sounds.  ?Neurological:  ?   Mental Status: She is alert and oriented to person, place, and time.  ? ? ? ?Lab Results: ? ?CBC ?   ?Component Value Date/Time  ? WBC 3.6 (L) 10/08/2021 0844  ? RBC 4.59 10/08/2021 0844  ? HGB 14.5 10/08/2021 0844  ? HGB 14.1 08/26/2020 0920  ? HCT 43.0 10/08/2021 0844  ? HCT 41.0 08/26/2020 0920  ? PLT 263 10/08/2021 0844  ? PLT 260 08/26/2020 0920  ? MCV 93.7 10/08/2021 0844  ? MCV 97 08/26/2020 0920  ? MCH 31.6 10/08/2021 0844  ? MCHC 33.7 10/08/2021 0844  ? RDW 13.1 10/08/2021 0844  ? RDW 13.0 08/26/2020 0920  ? ? ?BMET ?   ?Component Value Date/Time  ? NA 140 10/08/2021 0844  ? NA 141 08/26/2020 0920  ?  K 4.5 10/08/2021 0844  ? CL 103 10/08/2021 0844  ? CO2 29 10/08/2021 0844  ? GLUCOSE 72 10/08/2021 0844  ? BUN 15 10/08/2021 0844  ? BUN 12 08/26/2020 0920  ? CREATININE 0.88 10/08/2021 0844  ? CALCIUM 9.6 10/08/2021 0844  ? GFRNONAA 61 08/26/2020 0920  ? GFRAA 70 08/26/2020 0920  ? ? ? ?Assessment & Plan:  ? ?Healthcare maintenance ?- HgB A1c ?- Glucose (CBG) ?- POCT URINALYSIS DIP (CLINITEK) ?- Lipid Panel ? ? ?Follow up: ? ?Follow up in 6 months ? ? ? ? ?Ivonne Andrew, NP ?04/04/2022 ? ?

## 2022-04-04 NOTE — Patient Instructions (Addendum)
1. Healthcare maintenance ? ?- HgB A1c ?- Glucose (CBG) ?- POCT URINALYSIS DIP (CLINITEK) ?- Lipid Panel ? ? ?Follow up: ? ?Follow up in 6 months ? ?Lipid Profile Test ?Why am I having this test? ?The lipid profile test can be used to help evaluate your risk for developing heart disease. The test is also used to monitor your levels during treatment for high cholesterol to see if you are reaching your goals. ?What is being tested? ?A lipid profile measures the following: ?Total cholesterol. Cholesterol is a waxy, fat-like substance in your blood. If your total cholesterol level is high, this can increase your risk for heart disease. ?High-density lipoprotein (HDL). This is known as the good cholesterol. Having high levels of HDL decreases your risk for heart disease. Your HDL level may be low if you smoke or do not get enough exercise. ?Low-density lipoprotein (LDL). This is known as the bad cholesterol. This type causes plaque to build up in your arteries. Having a low level of LDL is best. Having high levels of LDL increases your risk for heart disease. ?Cholesterol to HDL ratio. This is calculated by dividing your total cholesterol by your HDL cholesterol. The ratio is used by health care providers to determine your risk for heart disease. A low ratio is best. ?Triglycerides. These are fats that your body can store or burn for energy. Low levels are best. Having high levels of triglycerides increases your risk for heart disease. ?What kind of sample is taken? ? ?A blood sample is required for this test. It is usually collected by inserting a needle into a blood vessel. ?How do I prepare for this test? ?Follow instructions from your health care provider about changing or stopping your regular medicines. ?Do not eat or drink anything except water starting 9-12 hours before your test, or as long as told by your health care provider. ?Do not drink alcohol starting at least 24 hours before your test. ?Follow any  instructions from your health care provider about dietary restrictions before your test. ?Tell a health care provider about: ?All medicines you are taking, including vitamins, herbs, eye drops, creams, and over-the-counter medicines. ?Any medical conditions you have. ?Whether you are pregnant or may be pregnant. ?How are the results reported? ?Your test results will be reported as values that indicate your cholesterol and triglyceride levels. Your health care provider will compare your results to normal ranges that were established after testing a large group of people (reference ranges). Reference ranges may vary among labs and hospitals. For this test, common reference ranges are: ?Total cholesterol ?Adult or elderly: less than 200 mg/dL. ?Child: 120-200 mg/dL. ?HDL ?Female: greater than 45 mg/dL. ?Female: greater than 55 mg/dL. ?HDL reference values indicating your risk for heart disease: ?Low risk for heart disease: ?Female: 60 mg/dL. ?Female: 70 mg/dL. ?Moderate risk for heart disease: ?Female: 45 mg/dL. ?Female: 55 mg/dL. ?High risk for heart disease: ?Female: 25 mg/dL. ?Female: 35 mg/dL. ?LDL ?Adults: Your health care provider will determine a target level for LDL based on your risk for heart disease. ?If you are at low risk, your LDL should be 130 mg/dL or less. ?If you are at moderate risk, your LDL should be 100 mg/dL or less. ?If you are at high risk, your LDL should be 70 mg/dL or less. ?Children: less than 110 mg/dL. ?Cholesterol to HDL ratio ?Reference values indicating your risk for heart disease: ?Risk that is half the average risk: ?Female: 3.4. ?Female: 3.3. ?Average risk: ?Female: 5.0. ?  Female: 4.4. ?Risk that is two times average (moderate risk): ?Female: 10.0. ?Female: 7.0. ?Risk that is three times average (high risk): ?Female: 24.0. ?Female: 11.0. ?Triglycerides ?Adult or elderly: ?Female: 40-160 mg/dL. ?Female: 35-135 mg/dL. ?Children 51-52 years old: ?Female: 40-163 mg/dL. ?Female: 40-128 mg/dL. ?Children 70-59  years old: ?Female: 36-138 mg/dL. ?Female: 41-138 mg/dL. ?Children 65-62 years old: ?Female: 31-108 mg/dL. ?Female: 35-114 mg/dL. ?Children 64-55 years old: ?Female: 30-86 mg/dL. ?Female: 32-99 mg/dL. ?Triglycerides should be less than 400 mg/dL even when you are not fasting. ?What do the results mean? ?Results that are within the reference ranges are considered normal. Total cholesterol, LDL, and triglyceride levels that are higher than the reference ranges can mean that you have an increased risk for heart disease. An HDL level that is lower than the reference range can also indicate an increased risk. ?Talk with your health care provider about what your results mean. ?Questions to ask your health care provider ?Ask your health care provider, or the department that is doing the test: ?When will my results be ready? ?How will I get my results? ?What are my treatment options? ?What other tests do I need? ?What are my next steps? ?Summary ?The lipid profile test can be used to help predict the likelihood that you will develop heart disease. It can also help monitor your cholesterol levels during treatment. ?A lipid profile measures your total cholesterol, high-density lipoprotein (HDL), low-density lipoprotein (LDL), cholesterol to HDL ratio, and triglycerides. ?Total cholesterol, LDL, and triglyceride levels that are higher than the reference ranges can indicate an increased risk for heart disease. ?An HDL level that is lower than the reference range can indicate an increased risk for heart disease. ?Talk with your health care provider about what your results mean. ?This information is not intended to replace advice given to you by your health care provider. Make sure you discuss any questions you have with your health care provider. ?Document Revised: 03/10/2021 Document Reviewed: 03/10/2021 ?Elsevier Patient Education ? 2023 [employer] Inc[employer] ? ? ?Fat and Cholesterol Restricted Eating Plan ?Getting too much fat and cholesterol  in your diet may cause health problems. Choosing the right foods helps keep your fat and cholesterol at normal levels. This can keep you from getting certain diseases. ? ? ?General tips ?Work with your doctor to lose weight if you need to. ?Avoid: ?Foods with added sugar. ?Fried foods. ?Foods with trans fat or partially hydrogenated oils. This includes some margarines and baked goods. ?If you drink alcohol: ?Limit how much you have to: ?0-1 drink a day for women who are not pregnant. ?0-2 drinks a day for men. ?Know how much alcohol is in a drink. In the [place] one drink equals one 12 oz bottle of beer (355 mL), one 5 oz glass of wine (148 mL), or one 1? oz glass of hard liquor (44 mL). ?Reading food labels ?Check food labels for: ?Trans fats. ?Partially hydrogenated oils. ?Saturated fat (g) in each serving. ?Cholesterol (mg) in each serving. ?Fiber (g) in each serving. ?Choose foods with healthy fats, such as: ?Monounsaturated fats and polyunsaturated fats. These include olive and canola oil, flaxseeds, walnuts, almonds, and seeds. ?Omega-3 fats. These are found in certain fish, flaxseed oil, and ground flaxseeds. ?Choose grain products that have whole grains. Look for the word "whole" as the first word in the ingredient list. ?Cooking ?Cook foods using low-fat methods. These include baking, boiling, grilling, and broiling. ?Eat more home-cooked foods. Eat at restaurants and buffets less often. Eat less  fast food. ?Avoid cooking using saturated fats, such as butter, cream, palm oil, palm kernel oil, and coconut oil. ?Meal planning ? ?At meals, divide your plate into four equal parts: ?Fill one-half of your plate with vegetables, green salads, and fruit. ?Fill one-fourth of your plate with whole grains. ?Fill one-fourth of your plate with low-fat (lean) protein foods. ?Eat fish that is high in omega-3 fats at least two times a week. This includes mackerel, tuna, sardines, and salmon. ?Eat foods that are high in  fiber, such as whole grains, beans, apples, pears, berries, broccoli, carrots, peas, and barley. ?What foods should I eat? ?Fruits ?All fresh, canned (in natural juice), or frozen fruits. ?Vegetables ?Fresh or

## 2022-04-04 NOTE — Assessment & Plan Note (Signed)
-   HgB A1c ?- Glucose (CBG) ?- POCT URINALYSIS DIP (CLINITEK) ?- Lipid Panel ? ? ?Follow up: ? ?Follow up in 6 months ?

## 2022-04-05 LAB — LIPID PANEL
Chol/HDL Ratio: 3.6 ratio (ref 0.0–4.4)
Cholesterol, Total: 238 mg/dL — ABNORMAL HIGH (ref 100–199)
HDL: 66 mg/dL (ref >39)
LDL Chol Calc (NIH): 154 mg/dL — ABNORMAL HIGH (ref 0–99)
Triglycerides: 105 mg/dL (ref 0–149)
VLDL Cholesterol Cal: 18 mg/dL (ref 5–40)

## 2022-08-29 ENCOUNTER — Other Ambulatory Visit: Payer: Self-pay | Admitting: Obstetrics and Gynecology

## 2022-08-29 DIAGNOSIS — Z1231 Encounter for screening mammogram for malignant neoplasm of breast: Secondary | ICD-10-CM

## 2022-09-22 DIAGNOSIS — L814 Other melanin hyperpigmentation: Secondary | ICD-10-CM | POA: Diagnosis not present

## 2022-09-22 DIAGNOSIS — D229 Melanocytic nevi, unspecified: Secondary | ICD-10-CM | POA: Diagnosis not present

## 2022-09-22 DIAGNOSIS — L821 Other seborrheic keratosis: Secondary | ICD-10-CM | POA: Diagnosis not present

## 2022-09-22 DIAGNOSIS — L578 Other skin changes due to chronic exposure to nonionizing radiation: Secondary | ICD-10-CM | POA: Diagnosis not present

## 2022-10-05 ENCOUNTER — Ambulatory Visit (INDEPENDENT_AMBULATORY_CARE_PROVIDER_SITE_OTHER): Payer: Medicare Other | Admitting: Nurse Practitioner

## 2022-10-05 ENCOUNTER — Encounter: Payer: Self-pay | Admitting: Nurse Practitioner

## 2022-10-05 VITALS — BP 100/69 | HR 58 | Temp 97.4°F | Ht 65.0 in | Wt 119.0 lb

## 2022-10-05 DIAGNOSIS — Z1159 Encounter for screening for other viral diseases: Secondary | ICD-10-CM | POA: Diagnosis not present

## 2022-10-05 DIAGNOSIS — Z13220 Encounter for screening for lipoid disorders: Secondary | ICD-10-CM

## 2022-10-05 DIAGNOSIS — Z0000 Encounter for general adult medical examination without abnormal findings: Secondary | ICD-10-CM | POA: Diagnosis not present

## 2022-10-05 DIAGNOSIS — Z130 Encounter for screening for diseases of the blood and blood-forming organs and certain disorders involving the immune mechanism: Secondary | ICD-10-CM | POA: Diagnosis not present

## 2022-10-05 NOTE — Progress Notes (Signed)
Complete physical exam  Patient: Kim Hanson   DOB: 08/03/1956   66 y.o. Female  MRN: [id number]  Subjective:    Chief Complaint  Patient presents with   Annual Exam    Fasting     Kim Hanson is a 65 y.o. female who presents today for a complete physical exam. She reports consuming a general diet. Home exercise routine includes Running QD and weight lifting . She generally feels well. She reports sleeping poorly. She does not have additional problems to discuss today. Up to date on mammogram, PAP, colonoscopy. Denies f/c/s, n/v/d, hemoptysis, PND, leg swelling Denies chest pain or edema     Most recent fall risk assessment:    10/03/2022   10:37 AM  Fall Risk   Falls in the past year? 0  Number falls in past yr: 0  Injury with Fall? 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 No pertinent past medical history. History reviewed. No pertinent surgical history. Social History   Tobacco Use   Smoking status: Never   Smokeless tobacco: Never  Vaping Use   Vaping Use: Never used  Substance Use Topics   Alcohol use: Yes    Comment: socially   Drug use: Never   Family History  Problem Relation Age of Onset   Hyperlipidemia Brother    No Known Allergies    Patient Care Team: Ivonne Andrew, NP as PCP - General (Adult Health Nurse Practitioner) Romualdo Bolk, MD as Consulting Physician (Obstetrics and Gynecology)   Outpatient Medications Prior to Visit  Medication Sig Note   [employer] SUNSCREEN SPF 30 EX apply topically to face and body daily for 30    fluorouracil (EFUDEX) 5 % cream Apply topically 2 (two) times daily. 10/05/2022: Prn last dose last week    MAGNESIUM PO Take by mouth.    No facility-administered  medications prior to visit.    ROS        Objective:     BP 100/69   Pulse (!) 58   Temp (!) 97.4 F (36.3 C)   Ht 5\' 5"  (1.651 m)   Wt 119 lb (54 kg)   SpO2 100%   BMI 19.80 kg/m  BP Readings from Last 3 Encounters:  10/05/22 100/69  04/04/22 (!) 123/92  10/08/21 120/72   Wt Readings from Last 3 Encounters:  10/05/22 119 lb (54 kg)  04/04/22 117 lb 3.2 oz (53.2 kg)  10/08/21 116 lb (52.6 kg)      Physical Exam   No results found for any visits on 10/05/22. Last CBC Lab Results  Component Value Date   WBC 3.6 (L) 10/08/2021   HGB 14.5 10/08/2021   HCT 43.0 10/08/2021   MCV 93.7 10/08/2021   MCH 31.6 10/08/2021   RDW 13.1 10/08/2021   PLT 263 10/08/2021   Last metabolic panel Lab Results  Component Value Date   GLUCOSE 72 10/08/2021   NA 140 10/08/2021   K 4.5 10/08/2021   CL 103 10/08/2021   CO2 29 10/08/2021   BUN 15 10/08/2021   CREATININE 0.88 10/08/2021   GFRNONAA 61 08/26/2020   CALCIUM 9.6 10/08/2021   PROT 6.8 10/08/2021  ALBUMIN 4.3 11/30/2020   LABGLOB 2.1 08/26/2020   AGRATIO 2.2 08/26/2020   BILITOT 0.8 10/08/2021   ALKPHOS 85 11/30/2020   AST 42 (H) 10/08/2021   ALT 41 (H) 10/08/2021   Last lipids Lab Results  Component Value Date   CHOL 238 (H) 04/04/2022   HDL 66 04/04/2022   LDLCALC 154 (H) 04/04/2022   TRIG 105 04/04/2022   CHOLHDL 3.6 04/04/2022   Last vitamin D No results found for: "25OHVITD2", "25OHVITD3", "VD25OH"      Assessment & Plan:    Routine Health Maintenance and Physical Exam  Immunization History  Administered Date(s) Administered   Influenza, High Dose Seasonal PF 09/19/2022   Influenza,inj,Quad PF,6+ Mos 08/18/2019   PFIZER(Purple Top)SARS-COV-2 Vaccination 02/24/2020   Tdap 12/12/2013, 08/21/2019    Health Maintenance  Topic Date Due   Medicare Annual Wellness (AWV)  Never done   Hepatitis C Screening  Never done   Zoster Vaccines- Shingrix (1 of 2) Never done   COVID-19 Vaccine (2 -  Pfizer risk series) 03/16/2020   PAP SMEAR-Modifier  08/16/2021   Pneumonia Vaccine 66+ Years old (1 - PCV) Never done   DEXA SCAN  Never done   HIV Screening  10/06/2023 (Originally 10/10/1971)   MAMMOGRAM  10/12/2023   COLONOSCOPY (Pts 45-29yrs Insurance coverage will need to be confirmed)  12/13/2023   TETANUS/TDAP  08/20/2029   INFLUENZA VACCINE  Completed   HPV VACCINES  Aged Out    Discussed health benefits of physical activity, and encouraged her to engage in regular exercise appropriate for her age and condition.  Problem List Items Addressed This Visit       Other   Healthcare maintenance - Primary   Other Visit Diagnoses     Lipid screening       Need for hepatitis C screening test       Encounter for screening for diseases of the blood and blood-forming organs and certain disorders involving the immune mechanism          Return in about 6 months (around 04/06/2023).     Fenton Foy, NP

## 2022-10-05 NOTE — Patient Instructions (Addendum)
1. Healthcare maintenance   2. Lipid screening   3. Need for hepatitis C screening test   4. Encounter for screening for diseases of the blood and blood-forming organs and certain disorders involving the immune mechanism   Follow up:  Follow up in 6 months

## 2022-10-06 LAB — COMPREHENSIVE METABOLIC PANEL
ALT: 33 IU/L — ABNORMAL HIGH (ref 0–32)
AST: 39 IU/L (ref 0–40)
Albumin/Globulin Ratio: 2.5 — ABNORMAL HIGH (ref 1.2–2.2)
Albumin: 4.2 g/dL (ref 3.9–4.9)
Alkaline Phosphatase: 68 IU/L (ref 44–121)
BUN/Creatinine Ratio: 16 (ref 12–28)
BUN: 14 mg/dL (ref 8–27)
Bilirubin Total: 0.5 mg/dL (ref 0.0–1.2)
CO2: 26 mmol/L (ref 20–29)
Calcium: 9.1 mg/dL (ref 8.7–10.3)
Chloride: 99 mmol/L (ref 96–106)
Creatinine, Ser: 0.87 mg/dL (ref 0.57–1.00)
Globulin, Total: 1.7 g/dL (ref 1.5–4.5)
Glucose: 82 mg/dL (ref 70–99)
Potassium: 4.2 mmol/L (ref 3.5–5.2)
Sodium: 139 mmol/L (ref 134–144)
Total Protein: 5.9 g/dL — ABNORMAL LOW (ref 6.0–8.5)
eGFR: 74 mL/min/{1.73_m2} (ref >59)

## 2022-10-06 LAB — CBC WITH DIFFERENTIAL/PLATELET
Basophils Absolute: 0 10*3/uL (ref 0.0–0.2)
Basos: 1 %
EOS (ABSOLUTE): 0.1 10*3/uL (ref 0.0–0.4)
Eos: 1 %
Hematocrit: 40 % (ref 34.0–46.6)
Hemoglobin: 13.6 g/dL (ref 11.1–15.9)
Immature Grans (Abs): 0 10*3/uL (ref 0.0–0.1)
Immature Granulocytes: 0 %
Lymphocytes Absolute: 1.9 10*3/uL (ref 0.7–3.1)
Lymphs: 38 %
MCH: 31.6 pg (ref 26.6–33.0)
MCHC: 34 g/dL (ref 31.5–35.7)
MCV: 93 fL (ref 79–97)
Monocytes Absolute: 0.5 10*3/uL (ref 0.1–0.9)
Monocytes: 10 %
Neutrophils Absolute: 2.5 10*3/uL (ref 1.4–7.0)
Neutrophils: 50 %
Platelets: 286 10*3/uL (ref 150–450)
RBC: 4.3 x10E6/uL (ref 3.77–5.28)
RDW: 13.5 % (ref 11.7–15.4)
WBC: 5 10*3/uL (ref 3.4–10.8)

## 2022-10-06 LAB — LIPID PANEL
Chol/HDL Ratio: 3.8 ratio (ref 0.0–4.4)
Cholesterol, Total: 240 mg/dL — ABNORMAL HIGH (ref 100–199)
HDL: 64 mg/dL (ref >39)
LDL Chol Calc (NIH): 163 mg/dL — ABNORMAL HIGH (ref 0–99)
Triglycerides: 78 mg/dL (ref 0–149)
VLDL Cholesterol Cal: 13 mg/dL (ref 5–40)

## 2022-10-06 LAB — HEPATITIS C ANTIBODY: Hep C Virus Ab: NONREACTIVE

## 2022-10-06 NOTE — Progress Notes (Signed)
66 y.o. G3P2103 Widowed White or Caucasian Not Hispanic or Latino female here for annual exam.  No vaginal bleeding.  Husband died in 2023-01-27. She has good support.    No LMP recorded. Patient is postmenopausal.          Sexually active: No.  The current method of family planning is post menopausal status.    Exercising: Yes.     Cardio and weights  , running Smoker:  no  Health Maintenance: Pap:  08/16/2018 WNL NEG HPV History of abnormal Pap:  no MMG:  10/11/21 Density C Bi-rads 1 neg  BMD:   none  Colonoscopy: 2017 per patient, f/u in 10 years.   TDaP:  08/21/19  Gardasil: n/a   reports that she has never smoked. She has never used smokeless tobacco. She reports current alcohol use. She reports that she does not use drugs. Retired, was a Charity fundraiser for [employer]. 3 kids, one daugther (lives here).  Son is in [place]. Other son in [place], [place]. 6 grand children. She is taking care of her 75 month old granddaughter part time.    History reviewed. No pertinent past medical history.  History reviewed. No pertinent surgical history.  Current Outpatient Medications  Medication Sig Dispense Refill   Flaxseed, Linseed, (FLAX SEED OIL PO) Take by mouth.     MAGNESIUM PO Take by mouth.     Multiple Vitamin (MULTIVITAMIN) tablet Take 1 tablet by mouth daily.     No current facility-administered medications for this visit.    Family History  Problem Relation Age of Onset   Hyperlipidemia Brother     Review of Systems  All other systems reviewed and are negative.   Exam:   BP 110/64   Pulse 73   Ht 5\' 4"  (1.626 m)   Wt 116 lb (52.6 kg)   SpO2 99%   BMI 19.91 kg/m   Weight change: @WEIGHTCHANGE @ Height:   Height: 5\' 4"  (162.6 cm)  Ht Readings from Last 3 Encounters:  10/13/22 5\' 4"  (1.626 m)  10/05/22 5\' 5"  (1.651 m)  04/04/22 5' 4.5" (1.638 m)    General appearance: alert, cooperative and appears stated age Head: Normocephalic, without obvious abnormality, atraumatic Neck: no adenopathy, supple,  symmetrical, trachea midline and thyroid normal to inspection and palpation Breasts: normal appearance, no masses or tenderness Abdomen: soft, non-tender; non distended,  no masses,  no organomegaly Extremities: extremities normal, atraumatic, no cyanosis or edema Skin: Skin color, texture, turgor normal. No rashes or lesions Lymph nodes: Cervical, supraclavicular, and axillary nodes normal. No abnormal inguinal nodes palpated Neurologic: Grossly normal   Pelvic: External genitalia:  no lesions              Urethra:  normal appearing urethra with no masses, tenderness or lesions              Bartholins and Skenes: normal                 Vagina: atrophic appearing vagina with normal color and discharge, no lesions              Cervix: no lesions               Bimanual Exam:  Uterus:  normal size, contour, position, consistency, mobility, non-tender              Adnexa: no mass, fullness, tenderness               Rectovaginal: Confirms  Anus:  normal sphincter tone, no lesions  Gae Dry, CMA chaperoned for the exam.  1. Encounter for breast and pelvic examination Discussed breast self exam Mammogram scheduled Colonoscopy UTD Labs with primary  2. Hypoestrogenism Discussed calcium and vit D intake Exercising  - DG Bone Density; Future  3. Screening for cervical cancer - Cytology - PAP

## 2022-10-13 ENCOUNTER — Encounter: Payer: Self-pay | Admitting: Obstetrics and Gynecology

## 2022-10-13 ENCOUNTER — Ambulatory Visit (INDEPENDENT_AMBULATORY_CARE_PROVIDER_SITE_OTHER): Payer: Medicare Other | Admitting: Obstetrics and Gynecology

## 2022-10-13 ENCOUNTER — Other Ambulatory Visit (HOSPITAL_COMMUNITY)
Admission: RE | Admit: 2022-10-13 | Discharge: 2022-10-13 | Disposition: A | Discharge status: Home / Self Care | Payer: Medicare Other | Source: Ambulatory Visit | Attending: Obstetrics and Gynecology | Admitting: Obstetrics and Gynecology

## 2022-10-13 VITALS — BP 110/64 | HR 73 | Ht 64.0 in | Wt 116.0 lb

## 2022-10-13 DIAGNOSIS — Z124 Encounter for screening for malignant neoplasm of cervix: Secondary | ICD-10-CM | POA: Diagnosis not present

## 2022-10-13 DIAGNOSIS — E2839 Other primary ovarian failure: Secondary | ICD-10-CM | POA: Diagnosis not present

## 2022-10-13 DIAGNOSIS — Z01419 Encounter for gynecological examination (general) (routine) without abnormal findings: Secondary | ICD-10-CM | POA: Diagnosis not present

## 2022-10-13 NOTE — Patient Instructions (Signed)

## 2022-10-14 ENCOUNTER — Ambulatory Visit
Admission: RE | Admit: 2022-10-14 | Discharge: 2022-10-14 | Disposition: A | Discharge status: Home / Self Care | Payer: Medicare Other | Source: Ambulatory Visit

## 2022-10-14 DIAGNOSIS — Z1231 Encounter for screening mammogram for malignant neoplasm of breast: Secondary | ICD-10-CM

## 2022-10-14 LAB — CYTOLOGY - PAP: Diagnosis: NEGATIVE

## 2022-11-24 ENCOUNTER — Other Ambulatory Visit: Payer: Medicare Other

## 2023-01-12 ENCOUNTER — Ambulatory Visit
Admission: RE | Admit: 2023-01-12 | Discharge: 2023-01-12 | Disposition: A | Discharge status: Home / Self Care | Payer: Medicare Other | Source: Ambulatory Visit | Attending: Obstetrics and Gynecology | Admitting: Obstetrics and Gynecology

## 2023-01-12 DIAGNOSIS — Z780 Asymptomatic menopausal state: Secondary | ICD-10-CM | POA: Diagnosis not present

## 2023-01-12 DIAGNOSIS — M8588 Other specified disorders of bone density and structure, other site: Secondary | ICD-10-CM | POA: Diagnosis not present

## 2023-01-12 DIAGNOSIS — E2839 Other primary ovarian failure: Secondary | ICD-10-CM

## 2023-01-12 DIAGNOSIS — M810 Age-related osteoporosis without current pathological fracture: Secondary | ICD-10-CM | POA: Diagnosis not present

## 2023-01-24 ENCOUNTER — Encounter: Payer: Self-pay | Admitting: Obstetrics and Gynecology

## 2023-01-24 ENCOUNTER — Ambulatory Visit (INDEPENDENT_AMBULATORY_CARE_PROVIDER_SITE_OTHER): Payer: Medicare Other | Admitting: Obstetrics and Gynecology

## 2023-01-24 VITALS — BP 122/72 | HR 62 | Wt 118.0 lb

## 2023-01-24 DIAGNOSIS — M810 Age-related osteoporosis without current pathological fracture: Secondary | ICD-10-CM

## 2023-01-24 MED ORDER — ALENDRONATE SODIUM 70 MG PO TABS: 70.0000 mg | ORAL_TABLET | ORAL | 3 refills | Status: DC

## 2023-01-24 NOTE — Patient Instructions (Signed)

## 2023-01-24 NOTE — Progress Notes (Signed)
GYNECOLOGY  VISIT   HPI: 67 y.o.   Widowed White or Caucasian Not Hispanic or Latino  female   [phone] with No LMP recorded. Patient is postmenopausal.   here to discuss her recent DEXA. DEXA from 01/13/23 returned with a T score of -2.5.  Labs from 10/05/22 reviewed:  Normal CBC CMP with mildly elevated ALT of 33, normal renal function, normal calcium.   She gets 4 servings of calcium/vit d in her diet a day.  She runs most days, doing weight training, working balance.  Non smoker, 1 drink a week.   GYNECOLOGIC HISTORY: No LMP recorded. Patient is postmenopausal. 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  No past surgical history on file.  Current Outpatient Medications  Medication Sig Dispense Refill   alendronate (FOSAMAX) 70 MG tablet Take 1 tablet (70 mg total) by mouth every 7 (seven) days. Take first thing in am with 6 oz. Water.  Be upright after taking.  Eat nothing for one hour. 12 tablet 3   Flaxseed, Linseed, (FLAX SEED OIL PO) Take by mouth.     MAGNESIUM PO Take by mouth.     Multiple Vitamin (MULTIVITAMIN) tablet Take 1 tablet by mouth daily.     No current facility-administered medications for this visit.     ALLERGIES: Patient has no known allergies.  Family History  Problem Relation Age of Onset   Hyperlipidemia Brother     Social History   Socioeconomic History   Marital status: Widowed    Spouse name: Not on file   Number of children: Not on file   Years of education: Not on file   Highest education level: Not on file  Occupational History   Not on file  Tobacco  Use   Smoking status: Never   Smokeless tobacco: Never  Vaping Use   Vaping Use: Never used  Substance and Sexual Activity   Alcohol use: Yes    Comment: socially   Drug use: Never   Sexual activity: Yes    Birth control/protection: Post-menopausal  Other Topics Concern   Not on file  Social History Narrative   RN   Social Determinants of Health   Financial Resource Strain: Not on file  Food Insecurity: Not on file  Transportation Needs: Not on file  Physical Activity: Not on file  Stress: Not on file  Social Connections: Not on file  Intimate Partner Violence: Not on file    Review of Systems  All other systems reviewed and are negative.   PHYSICAL EXAMINATION:    BP 122/72   Pulse 62   Wt 118 lb (53.5 kg)   SpO2 100%   BMI 20.25 kg/m     General appearance: alert, cooperative and appears stated age  31. Age-related osteoporosis without current pathological fracture Discussed calcium/vit d intake, exercise,  working on balance, discussed avoiding falls. - VITAMIN D 25 Hydroxy (Vit-D Deficiency, Fractures) -No contraindications to Fosamax, risks reviewed. Information from UTD given - alendronate (FOSAMAX) 70 MG tablet; Take 1 tablet (70 mg total) by mouth every 7 (seven) days. Take first thing in am with 6 oz. Water.  Be upright after taking.  Eat nothing for one hour.  Dispense: 12 tablet; Refill: 3

## 2023-01-25 LAB — VITAMIN D 25 HYDROXY (VIT D DEFICIENCY, FRACTURES): Vit D, 25-Hydroxy: 33 ng/mL (ref 30–100)

## 2023-02-05 ENCOUNTER — Encounter: Payer: Self-pay | Admitting: Obstetrics and Gynecology

## 2023-02-05 DIAGNOSIS — M810 Age-related osteoporosis without current pathological fracture: Secondary | ICD-10-CM

## 2023-02-07 NOTE — Telephone Encounter (Signed)
Please reach out to the patient for some details about her symptoms.  How many doses of the Fosamax has she tried? How soon after taking it did she get symptoms and how long have they lasted? Is she sure the symptoms are from the Fosamax? The risk of these GI side effects are small and may be less with Actonel.  Being on medication is smart to treat her osteoporosis, but obviously she needs to be on something she tolerates.

## 2023-02-08 MED ORDER — RISEDRONATE SODIUM 35 MG PO TABS: 35.0000 mg | ORAL_TABLET | ORAL | 0 refills | Status: DC

## 2023-02-23 ENCOUNTER — Telehealth: Payer: Self-pay | Admitting: Nurse Practitioner

## 2023-02-23 NOTE — Telephone Encounter (Signed)
Called patient to schedule Medicare Annual Wellness Visit (AWV). Left message for patient to call back and schedule Medicare Annual Wellness Visit (AWV).  Last date of AWV: AWVI eligible as of 09/11/2022   Please schedule an AWVI appointment at any time with Annual Wellness Visit.  If any questions, please contact me at [phone].    Thank you,  [place] Direct dial  [phone]

## 2023-03-01 ENCOUNTER — Other Ambulatory Visit: Payer: Self-pay | Admitting: Obstetrics and Gynecology

## 2023-03-01 DIAGNOSIS — M810 Age-related osteoporosis without current pathological fracture: Secondary | ICD-10-CM

## 2023-03-02 NOTE — Telephone Encounter (Signed)
  Pharmacy comment: REQUEST FOR 90 DAYS PRESCRIPTION. DX Code Needed.    (ICD 10 has been added to the Rx)

## 2023-03-13 DIAGNOSIS — K080 Exfoliation of teeth due to systemic causes: Secondary | ICD-10-CM | POA: Diagnosis not present

## 2023-03-16 ENCOUNTER — Ambulatory Visit (INDEPENDENT_AMBULATORY_CARE_PROVIDER_SITE_OTHER): Payer: Medicare Other

## 2023-03-16 VITALS — Ht 64.0 in | Wt 115.0 lb

## 2023-03-16 DIAGNOSIS — Z0000 Encounter for general adult medical examination without abnormal findings: Secondary | ICD-10-CM | POA: Diagnosis not present

## 2023-03-16 NOTE — Progress Notes (Signed)
Subjective:   Kim Hanson is a 67 y.o. female who presents for Medicare Annual (Subsequent) preventive examination.  Review of Systems    Virtual Visit via Telephone Note  I connected with  Kim Hanson on 03/16/23 at 10:15 AM EDT by telephone and verified that I am speaking with the correct person using two identifiers.  Location: Patient: Home Provider: Office Persons participating in the virtual visit: patient/Nurse Health Advisor   I discussed the limitations, risks, security and privacy concerns of performing an evaluation and management service by telephone and the availability of in person appointments. The patient expressed understanding and agreed to proceed.  Interactive audio and video telecommunications were attempted between this nurse and patient, however failed, due to patient having technical difficulties OR patient did not have access to video capability.  We continued and completed visit with audio only.  Some vital signs may be absent or patient reported.   Criselda Peaches, LPN  Cardiac Risk Factors include: advanced age (>29men, >2 women)     Objective:    Today's Vitals   03/16/23 1020  Weight: 115 lb (52.2 kg)  Height: 5\' 4"  (1.626 m)   Body mass index is 19.74 kg/m.     03/16/2023   10:27 AM  Advanced Directives  Does Patient Have a Medical Advance Directive? Yes  Type of Paramedic of [place];Living will  Copy of [place] in Chart? No - copy requested    Current Medications (verified) Outpatient Encounter Medications as of 03/16/2023  Medication Sig   Flaxseed, Linseed, (FLAX SEED OIL PO) Take by mouth.   MAGNESIUM PO Take by mouth.   Multiple Vitamin (MULTIVITAMIN) tablet Take 1 tablet by mouth daily.   risedronate (ACTONEL) 35 MG tablet TAKE 1 TABLET (35 MG TOTAL) BY MOUTH EVERY 7 (SEVEN) DAYS. WITH WATER ON EMPTY STOMACH, NOTHING BY MOUTH OR LIE DOWN FOR NEXT 30 MINUTES.   No  facility-administered encounter medications on file as of 03/16/2023.    Allergies (verified) Patient has no known allergies.   History: History reviewed. No pertinent past medical history. History reviewed. No pertinent surgical history. Family History  Problem Relation Age of Onset   Hyperlipidemia Brother    Social History   Socioeconomic History   Marital status: Widowed    Spouse name: Not on file   Number of children: Not on file   Years of education: Not on file   Highest education level: Not on file  Occupational History   Not on file  Tobacco Use   Smoking status: Never   Smokeless tobacco: Never  Vaping Use   Vaping Use: Never used  Substance and Sexual Activity   Alcohol use: Yes    Comment: socially   Drug use: Never   Sexual activity: Yes    Birth control/protection: Post-menopausal  Other Topics Concern   Not on file  Social History Narrative   RN   Social Determinants of Health   Financial Resource Strain: Low Risk  (03/16/2023)   Overall Financial Resource Strain (CARDIA)    Difficulty of Paying Living Expenses: Not hard at all  Food Insecurity: No Food Insecurity (03/16/2023)   Hunger Vital Sign    Worried About Running Out of Food in the Last Year: Never true    [place] in the Last Year: Never true  Transportation Needs: No Transportation Needs (03/16/2023)   PRAPARE - Hydrologist (Medical): No  Lack of Transportation (Non-Medical): No  Physical Activity: Sufficiently Active (03/16/2023)   Exercise Vital Sign    Days of Exercise per Week: 7 days    Minutes of Exercise per Session: 120 min  Stress: No Stress Concern Present (03/16/2023)   [place]    Feeling of Stress : Not at all  Social Connections: Moderately Integrated (03/16/2023)   Social Connection and Isolation Panel [NHANES]    Frequency of Communication with Friends and Family: More than  three times a week    Frequency of Social Gatherings with Friends and Family: More than three times a week    Attends Religious Services: More than 4 times per year    Active Member of [employer] or Organizations: Yes    Attends Archivist Meetings: More than 4 times per year    Marital Status: Widowed    Tobacco Counseling Counseling given: Not Answered   Clinical Intake:  Pre-visit preparation completed: No  Pain : No/denies pain     BMI - recorded: 19.74 Nutritional Status: BMI of 19-24  Normal Nutritional Risks: None Diabetes: No  How often do you need to have someone help you when you read instructions, pamphlets, or other written materials from your doctor or pharmacy?: 1 - Never  Diabetic?  No  Interpreter Needed?: No  Information entered by :: Rolene Arbour LPN   Activities of Daily Living    03/16/2023   10:25 AM 10/03/2022   10:37 AM  In your present state of health, do you have any difficulty performing the following activities:  Hearing? 0 0  Vision? 0 0  Difficulty concentrating or making decisions? 0 0  Walking or climbing stairs? 0 0  Dressing or bathing? 0 0  Doing errands, shopping? 0 0  Preparing Food and eating ? N N  Using the Toilet? N N  In the past six months, have you accidently leaked urine? N N  Do you have problems with loss of bowel control? N N  Managing your Medications? N N  Managing your Finances? N N  Housekeeping or managing your Housekeeping? N N    Patient Care Team: Fenton Foy, NP as PCP - General (Adult Health Nurse Practitioner) Salvadore Dom, MD as Consulting Physician (Obstetrics and Gynecology)  Indicate any recent Medical Services you may have received from other than [facility] providers in the past year (date may be approximate).     Assessment:   This is a routine wellness examination for Kim Hanson.  Hearing/Vision screen Hearing Screening - Comments:: Denies hearing difficulties   Vision  Screening - Comments:: Wears rx glasses - up to date with routine eye exams with  [facility]  Dietary issues and exercise activities discussed: Exercise limited by: None identified   Goals Addressed               This Visit's Progress     No urrent goals (pt-stated)         Depression Screen    03/16/2023   10:23 AM 04/04/2022    9:18 AM  PHQ 2/9 Scores  PHQ - 2 Score 0 0    Fall Risk    03/16/2023   10:26 AM 10/03/2022   10:37 AM 04/04/2022    9:18 AM  Fall Risk   Falls in the past year? 0 0 0  Number falls in past yr: 0 0 0  Injury with Fall? 0 0 0  Risk for  fall due to : No Fall Risks  No Fall Risks  Follow up Falls prevention discussed  Falls evaluation completed    [place]:  Any stairs in or around the home? Yes  If so, are there any without handrails? No  Home free of loose throw rugs in walkways, pet beds, electrical cords, etc? Yes  Adequate lighting in your home to reduce risk of falls? Yes   ASSISTIVE DEVICES UTILIZED TO PREVENT FALLS:  Life alert? No  Use of a cane, walker or w/c? No  Grab bars in the bathroom? No  Shower chair or bench in shower? Yes  Elevated toilet seat or a handicapped toilet? No   TIMED UP AND GO:  Was the test performed? No . Audio Visit   Cognitive Function:        03/16/2023   10:27 AM  6CIT Screen  What Year? 0 points  What month? 0 points  What time? 0 points  Count back from 20 0 points  Months in reverse 0 points  Repeat phrase 0 points  Total Score 0 points    Immunizations Immunization History  Administered Date(s) Administered   Influenza, High Dose Seasonal PF 09/19/2022   Influenza,inj,Quad PF,6+ Mos 08/18/2019   PFIZER(Purple Top)SARS-COV-2 Vaccination 02/24/2020   Tdap 12/12/2013, 08/21/2019    TDAP status: Up to date  Flu Vaccine status: Up to date  Pneumococcal vaccine status: Due, Education has been provided regarding the importance of this vaccine.  Advised may receive this vaccine at local pharmacy or Health Dept. Aware to provide a copy of the vaccination record if obtained from local pharmacy or Health Dept. Verbalized acceptance and understanding.  Covid-19 vaccine status: Completed vaccines  Qualifies for Shingles Vaccine? Yes   Zostavax completed No   Shingrix Completed?: No.    Education has been provided regarding the importance of this vaccine. Patient has been advised to call insurance company to determine out of pocket expense if they have not yet received this vaccine. Advised may also receive vaccine at local pharmacy or Health Dept. Verbalized acceptance and understanding.  Screening Tests Health Maintenance  Topic Date Due   COVID-19 Vaccine (2 - Pfizer risk series) 04/01/2023 (Originally 03/16/2020)   Zoster Vaccines- Shingrix (1 of 2) 06/15/2023 (Originally 10[birth year]10/1975)   Pneumonia Vaccine 19+ Years old (1 of 1 - PCV) 03/15/2024 (Originally 10/09/2021)   INFLUENZA VACCINE  07/13/2023   COLONOSCOPY (Pts 45-80yrs Insurance coverage will need to be confirmed)  12/13/2023   Medicare Annual Wellness (AWV)  03/15/2024   MAMMOGRAM  10/14/2024   DTaP/Tdap/Td (3 - Td or Tdap) 08/20/2029   DEXA SCAN  Completed   Hepatitis C Screening  Completed   HPV VACCINES  Aged Out    Health Maintenance  There are no preventive care reminders to display for this patient.   Colorectal cancer screening: Type of screening: Colonoscopy. Completed 12/12/13. Repeat every 10 years  Mammogram status: Completed 10/14/22. Repeat every year  Bone Density status: Completed 01/13/20. Results reflect: Bone density results: OSTEOPOROSIS. Repeat every   years.  Lung Cancer Screening: (Low Dose CT Chest recommended if Age 37-80 years, 30 pack-year currently smoking OR have quit w/in 15years.) does not qualify.    Additional Screening:  Hepatitis C Screening: does qualify; Completed 10/05/22  Vision Screening: Recommended annual ophthalmology exams  for early detection of glaucoma and other disorders of the eye. Is the patient up to date with their annual eye exam?  Yes  Who is the provider or what is the name of the office in which the patient attends annual eye exams? [facility] If pt is not established with a provider, would they like to be referred to a provider to establish care? No .   Dental Screening: Recommended annual dental exams for proper oral hygiene  Community Resource Referral / Chronic Care Management:  CRR required this visit?  No   CCM required this visit?  No      Plan:     I have personally reviewed and noted the following in the patient's chart:   Medical and social history Use of alcohol, tobacco or illicit drugs  Current medications and supplements including opioid prescriptions. Patient is not currently taking opioid prescriptions. Functional ability and status Nutritional status Physical activity Advanced directives List of other physicians Hospitalizations, surgeries, and ER visits in previous 12 months Vitals Screenings to include cognitive, depression, and falls Referrals and appointments  In addition, I have reviewed and discussed with patient certain preventive protocols, quality metrics, and best practice recommendations. A written personalized care plan for preventive services as well as general preventive health recommendations were provided to patient.     Criselda Peaches, LPN   [id number]   Nurse Notes: None

## 2023-03-16 NOTE — Patient Instructions (Addendum)
Kim Hanson , Thank you for taking time to come for your Medicare Wellness Visit. I appreciate your ongoing commitment to your health goals. Please review the following plan we discussed and let me know if I can assist you in the future.   These are the goals we discussed:  Goals       No urrent goals (pt-stated)        This is a list of the screening recommended for you and due dates:  Health Maintenance  Topic Date Due   COVID-19 Vaccine (2 - Pfizer risk series) 04/01/2023*   Zoster (Shingles) Vaccine (1 of 2) 06/15/2023*   Pneumonia Vaccine (1 of 1 - PCV) 03/15/2024*   Flu Shot  07/13/2023   Colon Cancer Screening  12/13/2023   Medicare Annual Wellness Visit  03/15/2024   Mammogram  10/14/2024   DTaP/Tdap/Td vaccine (3 - Td or Tdap) 08/20/2029   DEXA scan (bone density measurement)  Completed   Hepatitis C Screening: USPSTF Recommendation to screen - Ages 22-79 yo.  Completed   HPV Vaccine  Aged Out  *Topic was postponed. The date shown is not the original due date.    Advanced directives: Please bring a copy of your health care power of attorney and living will to the office to be added to your chart at your convenience.   Conditions/risks identified: None  Next appointment: Follow up in one year for your annual wellness visit    Preventive Care 65 Years and Older, Female Preventive care refers to lifestyle choices and visits with your health care provider that can promote health and wellness. What does preventive care include? A yearly physical exam. This is also called an annual well check. Dental exams once or twice a year. Routine eye exams. Ask your health care provider how often you should have your eyes checked. Personal lifestyle choices, including: Daily care of your teeth and gums. Regular physical activity. Eating a healthy diet. Avoiding tobacco and drug use. Limiting alcohol use. Practicing safe sex. Taking low-dose aspirin every day. Taking vitamin  and mineral supplements as recommended by your health care provider. What happens during an annual well check? The services and screenings done by your health care provider during your annual well check will depend on your age, overall health, lifestyle risk factors, and family history of disease. Counseling  Your health care provider may ask you questions about your: Alcohol use. Tobacco use. Drug use. Emotional well-being. Home and relationship well-being. Sexual activity. Eating habits. History of falls. Memory and ability to understand (cognition). Work and work Statistician. Reproductive health. Screening  You may have the following tests or measurements: Height, weight, and BMI. Blood pressure. Lipid and cholesterol levels. These may be checked every 5 years, or more frequently if you are over 49 years old. Skin check. Lung cancer screening. You may have this screening every year starting at age 29 if you have a 30-pack-year history of smoking and currently smoke or have quit within the past 15 years. Fecal occult blood test (FOBT) of the stool. You may have this test every year starting at age 46. Flexible sigmoidoscopy or colonoscopy. You may have a sigmoidoscopy every 5 years or a colonoscopy every 10 years starting at age 47. Hepatitis C blood test. Hepatitis B blood test. Sexually transmitted disease (STD) testing. Diabetes screening. This is done by checking your blood sugar (glucose) after you have not eaten for a while (fasting). You may have this done every 1-3 years. Bone density  scan. This is done to screen for osteoporosis. You may have this done starting at age 43. Mammogram. This may be done every 1-2 years. Talk to your health care provider about how often you should have regular mammograms. Talk with your health care provider about your test results, treatment options, and if necessary, the need for more tests. Vaccines  Your health care provider may recommend  certain vaccines, such as: Influenza vaccine. This is recommended every year. Tetanus, diphtheria, and acellular pertussis (Tdap, Td) vaccine. You may need a Td booster every 10 years. Zoster vaccine. You may need this after age 28. Pneumococcal 13-valent conjugate (PCV13) vaccine. One dose is recommended after age 88. Pneumococcal polysaccharide (PPSV23) vaccine. One dose is recommended after age 47. Talk to your health care provider about which screenings and vaccines you need and how often you need them. This information is not intended to replace advice given to you by your health care provider. Make sure you discuss any questions you have with your health care provider. Document Released: 12/25/2015 Document Revised: 08/17/2016 Document Reviewed: 09/29/2015 Elsevier Interactive Patient Education  2017 [place] Prevention in the Home Falls can cause injuries. They can happen to people of all ages. There are many things you can do to make your home safe and to help prevent falls. What can I do on the outside of my home? Regularly fix the edges of walkways and driveways and fix any cracks. Remove anything that might make you trip as you walk through a door, such as a raised step or threshold. Trim any bushes or trees on the path to your home. Use bright outdoor lighting. Clear any walking paths of anything that might make someone trip, such as rocks or tools. Regularly check to see if handrails are loose or broken. Make sure that both sides of any steps have handrails. Any raised decks and porches should have guardrails on the edges. Have any leaves, snow, or ice cleared regularly. Use sand or salt on walking paths during winter. Clean up any spills in your garage right away. This includes oil or grease spills. What can I do in the bathroom? Use night lights. Install grab bars by the toilet and in the tub and shower. Do not use towel bars as grab bars. Use non-skid mats or  decals in the tub or shower. If you need to sit down in the shower, use a plastic, non-slip stool. Keep the floor dry. Clean up any water that spills on the floor as soon as it happens. Remove soap buildup in the tub or shower regularly. Attach bath mats securely with double-sided non-slip rug tape. Do not have throw rugs and other things on the floor that can make you trip. What can I do in the bedroom? Use night lights. Make sure that you have a light by your bed that is easy to reach. Do not use any sheets or blankets that are too big for your bed. They should not hang down onto the floor. Have a firm chair that has side arms. You can use this for support while you get dressed. Do not have throw rugs and other things on the floor that can make you trip. What can I do in the kitchen? Clean up any spills right away. Avoid walking on wet floors. Keep items that you use a lot in easy-to-reach places. If you need to reach something above you, use a strong step stool that has a grab bar. Keep electrical  cords out of the way. Do not use floor polish or wax that makes floors slippery. If you must use wax, use non-skid floor wax. Do not have throw rugs and other things on the floor that can make you trip. What can I do with my stairs? Do not leave any items on the stairs. Make sure that there are handrails on both sides of the stairs and use them. Fix handrails that are broken or loose. Make sure that handrails are as long as the stairways. Check any carpeting to make sure that it is firmly attached to the stairs. Fix any carpet that is loose or worn. Avoid having throw rugs at the top or bottom of the stairs. If you do have throw rugs, attach them to the floor with carpet tape. Make sure that you have a light switch at the top of the stairs and the bottom of the stairs. If you do not have them, ask someone to add them for you. What else can I do to help prevent falls? Wear shoes that: Do not  have high heels. Have rubber bottoms. Are comfortable and fit you well. Are closed at the toe. Do not wear sandals. If you use a stepladder: Make sure that it is fully opened. Do not climb a closed stepladder. Make sure that both sides of the stepladder are locked into place. Ask someone to hold it for you, if possible. Clearly mark and make sure that you can see: Any grab bars or handrails. First and last steps. Where the edge of each step is. Use tools that help you move around (mobility aids) if they are needed. These include: Canes. Walkers. Scooters. Crutches. Turn on the lights when you go into a dark area. Replace any light bulbs as soon as they burn out. Set up your furniture so you have a clear path. Avoid moving your furniture around. If any of your floors are uneven, fix them. If there are any pets around you, be aware of where they are. Review your medicines with your doctor. Some medicines can make you feel dizzy. This can increase your chance of falling. Ask your doctor what other things that you can do to help prevent falls. This information is not intended to replace advice given to you by your health care provider. Make sure you discuss any questions you have with your health care provider. Document Released: 09/24/2009 Document Revised: 05/05/2016 Document Reviewed: 01/02/2015 Elsevier Interactive Patient Education  2017 [employer].

## 2023-04-06 ENCOUNTER — Encounter: Payer: Self-pay | Admitting: Nurse Practitioner

## 2023-04-06 ENCOUNTER — Ambulatory Visit (INDEPENDENT_AMBULATORY_CARE_PROVIDER_SITE_OTHER): Payer: Medicare Other | Admitting: Nurse Practitioner

## 2023-04-06 VITALS — BP 103/63 | HR 62 | Temp 97.0°F | Ht 64.5 in | Wt 116.0 lb

## 2023-04-06 DIAGNOSIS — Z0000 Encounter for general adult medical examination without abnormal findings: Secondary | ICD-10-CM

## 2023-04-06 DIAGNOSIS — Z13220 Encounter for screening for lipoid disorders: Secondary | ICD-10-CM | POA: Diagnosis not present

## 2023-04-06 NOTE — Patient Instructions (Addendum)
1. Lipid screening  - Lipid Panel   2. Routine health maintenance  - CBC - Comprehensive metabolic panel    Follow up:  Follow up in 3 months

## 2023-04-06 NOTE — Assessment & Plan Note (Signed)
-   Lipid Panel   2. Routine health maintenance  - CBC - Comprehensive metabolic panel    Follow up:  Follow up in 3 months

## 2023-04-06 NOTE — Progress Notes (Signed)
  ID: Kim Hanson, female    DOB: 1956/11/27, 67 y.o.   MRN: [id number]  Chief Complaint  Patient presents with   Follow-up    Over all health     Referring provider: Ivonne Andrew, NP   HPI  Patient presents today for follow-up visit.  She will need a recheck of lipid panel today.  She is not on cholesterol medication but does take flaxseed all.  Overall doing well.  Patient does participate in running groups. Denies f/c/s, n/v/d, hemoptysis, PND, leg swelling Denies chest pain or edema      No Known Allergies  Immunization History  Administered Date(s) Administered   Influenza, High Dose Seasonal PF 09/19/2022   Influenza,inj,Quad PF,6+ Mos 08/18/2019   PFIZER(Purple Top)SARS-COV-2 Vaccination 02/24/2020   Tdap 12/12/2013, 08/21/2019    History reviewed. No pertinent past medical history.  Tobacco History: Social History   Tobacco Use  Smoking Status Never  Smokeless Tobacco Never   Counseling given: Not Answered   Outpatient Encounter Medications as of 04/06/2023  Medication Sig   Flaxseed, Linseed, (FLAX SEED OIL PO) Take by mouth.   MAGNESIUM PO Take by mouth.   Multiple Vitamin (MULTIVITAMIN) tablet Take 1 tablet by mouth daily.   risedronate (ACTONEL) 35 MG tablet TAKE 1 TABLET (35 MG TOTAL) BY MOUTH EVERY 7 (SEVEN) DAYS. WITH WATER ON EMPTY STOMACH, NOTHING BY MOUTH OR LIE DOWN FOR NEXT 30 MINUTES.   No facility-administered encounter medications on file as of 04/06/2023.     Review of Systems  Review of Systems  Constitutional: Negative.   HENT: Negative.    Cardiovascular: Negative.   Gastrointestinal: Negative.   Allergic/Immunologic: Negative.   Neurological: Negative.   Psychiatric/Behavioral: Negative.         Physical Exam  BP 103/63   Pulse 62   Temp (!) 97 F (36.1 C)   Ht 5' 4.5" (1.638 m)   Wt 116 lb (52.6 kg)   SpO2 100%   BMI 19.60 kg/m   Wt Readings from Last 5 Encounters:  04/06/23 116 lb (52.6 kg)   03/16/23 115 lb (52.2 kg)  01/24/23 118 lb (53.5 kg)  10/13/22 116 lb (52.6 kg)  10/05/22 119 lb (54 kg)     Physical Exam Vitals and nursing note reviewed.  Constitutional:      General: She is not in acute distress.    Appearance: She is well-developed.  Cardiovascular:     Rate and Rhythm: Normal rate and regular rhythm.  Pulmonary:     Effort: Pulmonary effort is normal.     Breath sounds: Normal breath sounds.  Neurological:     Mental Status: She is alert and oriented to person, place, and time.      Lab Results:  CBC    Component Value Date/Time   WBC 5.0 10/05/2022 0927   WBC 3.6 (L) 10/08/2021 0844   RBC 4.30 10/05/2022 0927   RBC 4.59 10/08/2021 0844   HGB 13.6 10/05/2022 0927   HCT 40.0 10/05/2022 0927   PLT 286 10/05/2022 0927   MCV 93 10/05/2022 0927   MCH 31.6 10/05/2022 0927   MCH 31.6 10/08/2021 0844   MCHC 34.0 10/05/2022 0927   MCHC 33.7 10/08/2021 0844   RDW 13.5 10/05/2022 0927   LYMPHSABS 1.9 10/05/2022 0927   EOSABS 0.1 10/05/2022 0927   BASOSABS 0.0 10/05/2022 0927    BMET    Component Value Date/Time   NA 139 10/05/2022 0927   K 4.2 10/05/2022  0927   CL 99 10/05/2022 0927   CO2 26 10/05/2022 0927   GLUCOSE 82 10/05/2022 0927   GLUCOSE 72 10/08/2021 0844   BUN 14 10/05/2022 0927   CREATININE 0.87 10/05/2022 0927   CREATININE 0.88 10/08/2021 0844   CALCIUM 9.1 10/05/2022 0927   GFRNONAA 61 08/26/2020 0920   GFRAA 70 08/26/2020 0920      Assessment & Plan:   Lipid screening - Lipid Panel   2. Routine health maintenance  - CBC - Comprehensive metabolic panel    Follow up:  Follow up in 3 months     Ivonne Andrew, NP 04/06/2023

## 2023-04-07 ENCOUNTER — Other Ambulatory Visit: Payer: Self-pay | Admitting: Nurse Practitioner

## 2023-04-07 LAB — CBC
Hematocrit: 41 % (ref 34.0–46.6)
Hemoglobin: 13.5 g/dL (ref 11.1–15.9)
MCH: 31 pg (ref 26.6–33.0)
MCHC: 32.9 g/dL (ref 31.5–35.7)
MCV: 94 fL (ref 79–97)
Platelets: 340 10*3/uL (ref 150–450)
RBC: 4.35 x10E6/uL (ref 3.77–5.28)
RDW: 13.3 % (ref 11.7–15.4)
WBC: 5.7 10*3/uL (ref 3.4–10.8)

## 2023-04-07 LAB — COMPREHENSIVE METABOLIC PANEL
ALT: 28 IU/L (ref 0–32)
AST: 24 IU/L (ref 0–40)
Albumin/Globulin Ratio: 2.1 (ref 1.2–2.2)
Albumin: 4.1 g/dL (ref 3.9–4.9)
Alkaline Phosphatase: 74 IU/L (ref 44–121)
BUN/Creatinine Ratio: 18 (ref 12–28)
BUN: 15 mg/dL (ref 8–27)
Bilirubin Total: 0.6 mg/dL (ref 0.0–1.2)
CO2: 23 mmol/L (ref 20–29)
Calcium: 9 mg/dL (ref 8.7–10.3)
Chloride: 103 mmol/L (ref 96–106)
Creatinine, Ser: 0.82 mg/dL (ref 0.57–1.00)
Globulin, Total: 2 g/dL (ref 1.5–4.5)
Glucose: 92 mg/dL (ref 70–99)
Potassium: 4.3 mmol/L (ref 3.5–5.2)
Sodium: 140 mmol/L (ref 134–144)
Total Protein: 6.1 g/dL (ref 6.0–8.5)
eGFR: 79 mL/min/{1.73_m2} (ref >59)

## 2023-04-07 LAB — LIPID PANEL
Chol/HDL Ratio: 3.6 ratio (ref 0.0–4.4)
Cholesterol, Total: 253 mg/dL — ABNORMAL HIGH (ref 100–199)
HDL: 71 mg/dL (ref >39)
LDL Chol Calc (NIH): 161 mg/dL — ABNORMAL HIGH (ref 0–99)
Triglycerides: 120 mg/dL (ref 0–149)
VLDL Cholesterol Cal: 21 mg/dL (ref 5–40)

## 2023-04-07 MED ORDER — ROSUVASTATIN CALCIUM 5 MG PO TABS: 5.0000 mg | ORAL_TABLET | Freq: Every day | ORAL | 11 refills | Status: DC

## 2023-04-11 DIAGNOSIS — H524 Presbyopia: Secondary | ICD-10-CM | POA: Diagnosis not present

## 2023-06-16 ENCOUNTER — Ambulatory Visit (INDEPENDENT_AMBULATORY_CARE_PROVIDER_SITE_OTHER): Payer: Medicare Other | Admitting: Orthopaedic Surgery

## 2023-06-16 ENCOUNTER — Ambulatory Visit (INDEPENDENT_AMBULATORY_CARE_PROVIDER_SITE_OTHER): Payer: Medicare Other

## 2023-06-16 DIAGNOSIS — M25571 Pain in right ankle and joints of right foot: Secondary | ICD-10-CM

## 2023-06-16 DIAGNOSIS — M76821 Posterior tibial tendinitis, right leg: Secondary | ICD-10-CM | POA: Diagnosis not present

## 2023-06-16 NOTE — Progress Notes (Signed)
Chief Complaint: Right foot pain     History of Present Illness:    Kim Hanson is a 67 y.o. female presents today with tenderness pain over the PT tendon which has been fluctuating in nature.  She does state that flatfootedness runs in her family.  She does have a history of enjoying running longer distances.  She lives here in [place].  She has previously tried a compression sleeve which is helpful with swelling    Surgical History:   None  PMH/PSH/Family History/Social History/Meds/Allergies:   No past medical history on file. No past surgical history on file. Social History   Socioeconomic History   Marital status: Widowed    Spouse name: Not on file   Number of children: Not on file   Years of education: Not on file   Highest education level: Master's degree (e.g., MA, MS, MEng, MEd, MSW, MBA)  Occupational History   Not on file  Tobacco Use   Smoking status: Never   Smokeless tobacco: Never  Vaping Use   Vaping Use: Never used  Substance and Sexual Activity   Alcohol use: Yes    Comment: socially   Drug use: Never   Sexual activity: Yes    Birth control/protection: Post-menopausal  Other Topics Concern   Not on file  Social History Narrative   RN   Social Determinants of Health   Financial Resource Strain: Low Risk  (04/02/2023)   Overall Financial Resource Strain (CARDIA)    Difficulty of Paying Living Expenses: Not very hard  Food Insecurity: No Food Insecurity (04/02/2023)   Hunger Vital Sign    Worried About Running Out of Food in the Last Year: Never true    Ran Out of Food in the Last Year: Never true  Transportation Needs: No Transportation Needs (04/02/2023)   PRAPARE - Administrator, Civil Service (Medical): No    Lack of Transportation (Non-Medical): No  Physical Activity: Sufficiently Active (04/02/2023)   Exercise Vital Sign    Days of Exercise per Week: 7 days    Minutes of Exercise per  Session: 120 min  Stress: No Stress Concern Present (04/02/2023)   [employer] of Occupational Health - Occupational Stress Questionnaire    Feeling of Stress : Not at all  Social Connections: Moderately Integrated (04/02/2023)   Social Connection and Isolation Panel [NHANES]    Frequency of Communication with Friends and Family: More than three times a week    Frequency of Social Gatherings with Friends and Family: Three times a week    Attends Religious Services: More than 4 times per year    Active Member of Clubs or Organizations: Yes    Attends Banker Meetings: 1 to 4 times per year    Marital Status: Widowed   Family History  Problem Relation Age of Onset   Hyperlipidemia Brother    No Known Allergies Current Outpatient Medications  Medication Sig Dispense Refill   rosuvastatin (CRESTOR) 5 MG tablet Take 1 tablet (5 mg total) by mouth daily. 30 tablet 11   Flaxseed, Linseed, (FLAX SEED OIL PO) Take by mouth.     MAGNESIUM PO Take by mouth.     Multiple Vitamin (MULTIVITAMIN) tablet Take 1 tablet by mouth daily.     risedronate (ACTONEL) 35 MG tablet TAKE  1 TABLET (35 MG TOTAL) BY MOUTH EVERY 7 (SEVEN) DAYS. WITH WATER ON EMPTY STOMACH, NOTHING BY MOUTH OR LIE DOWN FOR NEXT 30 MINUTES. 12 tablet 3   No current facility-administered medications for this visit.   No results found.  Review of Systems:   A ROS was performed including pertinent positives and negatives as documented in the HPI.  Physical Exam :   Constitutional: NAD and appears stated age Neurological: Alert and oriented Psych: Appropriate affect and cooperative There were no vitals taken for this visit.   Comprehensive Musculoskeletal Exam:    Tenderness over the PT tendon.  There is significant flatfoot deformity.  This is not correctable.  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   Assessment:   67 y.o. female with severe flatfoot in the setting of likely PT tendinitis as well.  At this time she is not hoping to have any type of surgical intervention that would keep her out of running.  Given this I do believe she would benefit from some shock therapy about the PT tendon with Dr. Shon Baton.  I do also believe that she would benefit from a rigid carbon fiber insole during running to help accommodate her flatfoot and relieve some of the stress of this as she goes to repetitive cycles.  I will plan to see her back as needed  Plan :    -Return to clinic as needed     I personally saw and evaluated the patient, and participated in the management and treatment plan.  Huel Cote, MD Attending Physician, Orthopedic Surgery  This document was dictated using Dragon voice recognition software. A reasonable attempt at proof reading has been made to minimize errors.

## 2023-08-01 ENCOUNTER — Encounter: Payer: Self-pay | Admitting: Sports Medicine

## 2023-08-01 ENCOUNTER — Ambulatory Visit: Payer: Medicare Other | Admitting: Sports Medicine

## 2023-08-01 DIAGNOSIS — M25572 Pain in left ankle and joints of left foot: Secondary | ICD-10-CM | POA: Diagnosis not present

## 2023-08-01 DIAGNOSIS — Q666 Other congenital valgus deformities of feet: Secondary | ICD-10-CM

## 2023-08-01 DIAGNOSIS — M21619 Bunion of unspecified foot: Secondary | ICD-10-CM

## 2023-08-01 DIAGNOSIS — M25571 Pain in right ankle and joints of right foot: Secondary | ICD-10-CM | POA: Diagnosis not present

## 2023-08-01 NOTE — Progress Notes (Signed)
Kim Hanson - 67 y.o. female MRN [id number]  Date of birth: Jan 07, 1956  Office Visit Note: Visit Date: 08/01/2023 PCP: Kim Andrew, NP Referred by: Kim Andrew, NP  Subjective: Chief Complaint  Patient presents with   Right Foot - Follow-up   Left Foot - Follow-up   HPI: Kim Hanson is a very pleasant 67 y.o. female who presents today for bilateral foot and ankle pain.   Previously saw my partner Dr. Steward Hanson who did diagnose her with likely posterior tibial tendinitis of the right foot.  Today Kim Hanson states that her pain is in the left foot today.  The pain in her right foot and ankle has completely resolved.  Was able to run 8 miles the other day without pain.  Pain in the left foot and ankle is over the anterior and medial side.  No known injury, no redness or swelling.  Was told that she has flatfeet and recommended carbon fiber insoles.  She did buy an over-the-counter gel insert.  She is a very fit individual, runs consistently for exercise. Runs in [place] shoes.  Pertinent ROS were reviewed with the patient and found to be negative unless otherwise specified above in HPI.   Assessment & Plan: Visit Diagnoses:  1. Pain in left ankle and joints of left foot   2. Pain in right ankle and joints of right foot   3. Pes planovalgus    Plan: Discussed with Kim Hanson the nature of her bilateral foot and ankle pain.  Her right ankle and PT tendinitis has completely resolved.  She is having some pain on the left side.  I discussed that this is likely a biomechanical issue and not a true injury.  We did assess her gait today which shows excessive pronation and positive crossover sign which is likely a combination of her significant pes planovalgus as well as some weakness with hip abduction.  We did fit her for green sports insoles with a scaphoid pad to help support her arch.  She found these to be very comfortable.  She will wear these in her everyday shoes, may wear  these with running if she finds them comfortable but does not have to.  I did give her a handout for hip abduction strengthening and stabilization series which she may perform daily to every other day.  She will follow-up with me as needed. Ok for running without restriction.  Follow-up: Return if symptoms worsen or fail to improve.   Meds & Orders: No orders of the defined types were placed in this encounter.  No orders of the defined types were placed in this encounter.    Procedures: No procedures performed      Clinical History: No specialty comments available.  She reports that she has never smoked. She has never used smokeless tobacco. No results for input(s): "HGBA1C", "LABURIC" in the last 8760 hours.  Objective:   Vital Signs: There were no vitals taken for this visit.  Physical Exam  Gen: Well-appearing, in no acute distress; non-toxic CV: Well-perfused. Warm.  Resp: Breathing unlabored on room air; no wheezing. Psych: Fluid speech in conversation; appropriate affect; normal thought process Neuro: Sensation intact throughout. No gross coordination deficits.   Ortho Exam - Bilateral feet/ankles: There is no redness or swelling.  There is no generalized tenderness to palpation over the medial aspect of the foot just distal to the navicular.  No bony TTP.  Full range of motion of the ankle in both directions.  There  is bilateral but left greater than right bunion deformity  - Gait analysis: Patient is a midfoot strike..  There is excessive pronation bilaterally with swing and stance phase.  Positive crossover sign at the knees.   Imaging:  DG Ankle Complete Right CLINICAL DATA:  Pain  EXAM: RIGHT ANKLE - COMPLETE 3 VIEW  COMPARISON:  None Available.  FINDINGS: There is no evidence of fracture, dislocation, or joint effusion. There is no evidence of arthropathy or other focal bone abnormality. Soft tissues are unremarkable. Well corticated osseous fragment at the  medial malleolus may be related to an old injury.  IMPRESSION: No acute osseous abnormalities.  Electronically Signed   By: Kim Hanson M.D.   On: 06/21/2023 21:09    Past Medical/Family/Surgical/Social History: Medications & Allergies reviewed per EMR, new medications updated. 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 Family History  Problem Relation Age of Onset   Hyperlipidemia Brother    No past surgical history on file. Social History   Occupational History   Not on file  Tobacco Use   Smoking status: Never   Smokeless tobacco: Never  Vaping Use   Vaping status: Never Used  Substance and Sexual Activity   Alcohol use: Yes    Comment: socially   Drug use: Never   Sexual activity: Yes    Birth control/protection: Post-menopausal   I spent 37 minutes in the care of the patient today including face-to-face time, preparation to see the patient, as well as review of right ankle x-ray from 06/16/2023, time spent with hip abduction strengthening and gait analysis both in and out of insoles for the above diagnoses.   Kim Brunner, DO Primary Care Sports Medicine Physician  [facility] - Orthopedics  This note was dictated using Dragon naturally speaking software and may contain errors in syntax, spelling, or content which have not been identified prior to signing this note.

## 2023-09-20 DIAGNOSIS — K080 Exfoliation of teeth due to systemic causes: Secondary | ICD-10-CM | POA: Diagnosis not present

## 2023-09-26 DIAGNOSIS — L821 Other seborrheic keratosis: Secondary | ICD-10-CM | POA: Diagnosis not present

## 2023-09-26 DIAGNOSIS — D229 Melanocytic nevi, unspecified: Secondary | ICD-10-CM | POA: Diagnosis not present

## 2023-09-26 DIAGNOSIS — L814 Other melanin hyperpigmentation: Secondary | ICD-10-CM | POA: Diagnosis not present

## 2023-09-26 DIAGNOSIS — L578 Other skin changes due to chronic exposure to nonionizing radiation: Secondary | ICD-10-CM | POA: Diagnosis not present

## 2023-10-06 ENCOUNTER — Ambulatory Visit: Payer: Self-pay | Admitting: Nurse Practitioner

## 2023-10-18 ENCOUNTER — Other Ambulatory Visit: Payer: Self-pay | Admitting: Nurse Practitioner

## 2023-10-18 DIAGNOSIS — Z1231 Encounter for screening mammogram for malignant neoplasm of breast: Secondary | ICD-10-CM

## 2023-10-19 ENCOUNTER — Ambulatory Visit
Admission: RE | Admit: 2023-10-19 | Discharge: 2023-10-19 | Disposition: A | Discharge status: Home / Self Care | Payer: Medicare Other | Source: Ambulatory Visit | Attending: Nurse Practitioner | Admitting: Nurse Practitioner

## 2023-10-19 DIAGNOSIS — Z1231 Encounter for screening mammogram for malignant neoplasm of breast: Secondary | ICD-10-CM

## 2024-04-02 ENCOUNTER — Encounter

## 2024-05-21 DIAGNOSIS — K080 Exfoliation of teeth due to systemic causes: Secondary | ICD-10-CM | POA: Diagnosis not present

## 2024-05-23 ENCOUNTER — Encounter

## 2024-05-24 ENCOUNTER — Encounter

## 2024-05-30 ENCOUNTER — Encounter

## 2024-05-30 ENCOUNTER — Ambulatory Visit

## 2024-07-11 ENCOUNTER — Telehealth: Payer: Self-pay | Admitting: Nurse Practitioner

## 2024-08-08 ENCOUNTER — Ambulatory Visit (INDEPENDENT_AMBULATORY_CARE_PROVIDER_SITE_OTHER)

## 2024-08-08 VITALS — Ht 65.0 in | Wt 125.0 lb

## 2024-08-08 DIAGNOSIS — Z0000 Encounter for general adult medical examination without abnormal findings: Secondary | ICD-10-CM | POA: Diagnosis not present

## 2024-08-08 NOTE — Patient Instructions (Signed)
 Bone Density Test: What to Expect A bone density test uses a type of X-ray to measure the amount of calcium and other minerals in your bones. It can measure bone density in the hip and the spine. This test may also be called: Bone densitometry. Bone mineral density test. Dual-energy X-ray absorptiometry (DEXA). You may have this test to: Diagnose or screen for a condition that causes weak or thin bones, called osteoporosis. See what your risk is for a broken bone, also called a fracture. Check how well your treatment for weak or thin bones is working. The test is similar to having a regular X-ray. Tell a health care provider about: Any allergies you have. All medicines you're taking, including vitamins, herbs, eye drops, creams, and over-the-counter medicines. Any problems you or family members have had with anesthesia. Any bleeding problems you have. Any surgeries you've had. Any medical conditions you have. Whether you're pregnant or may be pregnant. Any medical tests you've had within the past 14 days that used contrast. What are the risks? Your health care provider will talk with you about risks. These may include: Being exposed to a small amount of radiation. This can slightly increase your cancer risk. What happens before the test? Do not take any calcium supplements within the 24 hours before your test. You'll need to take off: All metal jewelry. Eyeglasses. Removable dental appliances. Any other metal objects on your body. What happens during the test?  You'll lie down on an exam table. There will be an X-ray machine below you and an imaging device above you. Other devices, such as boxes or braces, may be used to position your body for the scan. The machine will slowly scan your body. You'll need to keep very still while the machine does the scan. The images will show up on a screen in the room. Images will be checked by a specialist after your test is finished. These  steps may vary. Ask what you can expect. What can I expect after the test? Ask when your results will be ready and how to get them. You may need to call or meet with your provider to get your results. This information is not intended to replace advice given to you by your health care provider. Make sure you discuss any questions you have with your health care provider. Document Revised: 04/09/2023 Document Reviewed: 04/09/2023 Elsevier Patient Education  2024 Elsevier Inc. Health Maintenance, Female Adopting a healthy lifestyle and getting preventive care are important in promoting health and wellness. Ask your health care provider about: The right schedule for you to have regular tests and exams. Things you can do on your own to prevent diseases and keep yourself healthy. What should I know about diet, weight, and exercise? Eat a healthy diet  Eat a diet that includes plenty of vegetables, fruits, low-fat dairy products, and lean protein. Do not eat a lot of foods that are high in solid fats, added sugars, or sodium. Maintain a healthy weight Body mass index (BMI) is used to identify weight problems. It estimates body fat based on height and weight. Your health care provider can help determine your BMI and help you achieve or maintain a healthy weight. Get regular exercise Get regular exercise. This is one of the most important things you can do for your health. Most adults should: Exercise for at least 150 minutes each week. The exercise should increase your heart rate and make you sweat (moderate-intensity exercise). Do strengthening exercises at least  twice a week. This is in addition to the moderate-intensity exercise. Spend less time sitting. Even light physical activity can be beneficial. Watch cholesterol and blood lipids Have your blood tested for lipids and cholesterol at 68 years of age, then have this test every 5 years. Have your cholesterol levels checked more often if: Your  lipid or cholesterol levels are high. You are older than 67 years of age. You are at high risk for heart disease. What should I know about cancer screening? Depending on your health history and family history, you may need to have cancer screening at various ages. This may include screening for: Breast cancer. Cervical cancer. Colorectal cancer. Skin cancer. Lung cancer. What should I know about heart disease, diabetes, and high blood pressure? Blood pressure and heart disease High blood pressure causes heart disease and increases the risk of stroke. This is more likely to develop in people who have high blood pressure readings or are overweight. Have your blood pressure checked: Every 3-5 years if you are 41-6 years of age. Every year if you are 8 years old or older. Diabetes Have regular diabetes screenings. This checks your fasting blood sugar level. Have the screening done: Once every three years after age 57 if you are at a normal weight and have a low risk for diabetes. More often and at a younger age if you are overweight or have a high risk for diabetes. What should I know about preventing infection? Hepatitis B If you have a higher risk for hepatitis B, you should be screened for this virus. Talk with your health care provider to find out if you are at risk for hepatitis B infection. Hepatitis C Testing is recommended for: Everyone born from [birth year] through 1965. Anyone with known risk factors for hepatitis C. Sexually transmitted infections (STIs) Get screened for STIs, including gonorrhea and chlamydia, if: You are sexually active and are younger than 68 years of age. You are older than 68 years of age and your health care provider tells you that you are at risk for this type of infection. Your sexual activity has changed since you were last screened, and you are at increased risk for chlamydia or gonorrhea. Ask your health care provider if you are at risk. Ask your health  care provider about whether you are at high risk for HIV. Your health care provider may recommend a prescription medicine to help prevent HIV infection. If you choose to take medicine to prevent HIV, you should first get tested for HIV. You should then be tested every 3 months for as long as you are taking the medicine. Pregnancy If you are about to stop having your period (premenopausal) and you may become pregnant, seek counseling before you get pregnant. Take 400 to 800 micrograms (mcg) of folic acid every day if you become pregnant. Ask for birth control (contraception) if you want to prevent pregnancy. Osteoporosis and menopause Osteoporosis is a disease in which the bones lose minerals and strength with aging. This can result in bone fractures. If you are 63 years old or older, or if you are at risk for osteoporosis and fractures, ask your health care provider if you should: Be screened for bone loss. Take a calcium or vitamin D supplement to lower your risk of fractures. Be given hormone replacement therapy (HRT) to treat symptoms of menopause. Follow these instructions at home: Alcohol use Do not drink alcohol if: Your health care provider tells you not to drink. You are pregnant,  may be pregnant, or are planning to become pregnant. If you drink alcohol: Limit how much you have to: 0-1 drink a day. Know how much alcohol is in your drink. In the [place] one drink equals one 12 oz bottle of beer (355 mL), one 5 oz glass of wine (148 mL), or one 1 oz glass of hard liquor (44 mL). Lifestyle Do not use any products that contain nicotine or tobacco. These products include cigarettes, chewing tobacco, and vaping devices, such as e-cigarettes. If you need help quitting, ask your health care provider. Do not use street drugs. Do not share needles. Ask your health care provider for help if you need support or information about quitting drugs. General instructions Schedule regular health,  dental, and eye exams. Stay current with your vaccines. Tell your health care provider if: You often feel depressed. You have ever been abused or do not feel safe at home. Summary Adopting a healthy lifestyle and getting preventive care are important in promoting health and wellness. Follow your health care provider's instructions about healthy diet, exercising, and getting tested or screened for diseases. Follow your health care provider's instructions on monitoring your cholesterol and blood pressure. This information is not intended to replace advice given to you by your health care provider. Make sure you discuss any questions you have with your health care provider. Document Revised: 04/19/2021 Document Reviewed: 04/19/2021 Elsevier Patient Education  2024 Elsevier Inc. Mammogram: What to Expect A mammogram is an X-ray of your breasts. It's done to check for changes that aren't normal. If you're female, you may need this test if you have a lump or swelling in your breast tissue. If you're female, you'll likely need to start getting this test at around age 69. You may need to get one sooner if you're at higher risk for breast cancer. This test can look for changes caused by breast cancer or by other problems, such as: Mastitis. This is when breast tissue gets irritated and swollen. An abscess. This is an infected area filled with pus. A pocket of fluid called a cyst. An abnormal growth of cells that isn't cancer. Fibrocystic changes. These changes can make breast tissue feel dense, bumpy, or uneven under the skin. Tell a health care provider: About any allergies you have. If you have breast implants. If you've had: Breast disease. A biopsy. This is when a small piece of tissue is removed for testing. Surgery. If someone else in your family has had breast cancer. If you're breastfeeding. Whether you're pregnant or may be pregnant. What are the risks? Your health care provider will  talk with you about risks. These may include: Being exposed to radiation. Radiation levels are very low with this test. The need for more tests. The results not being read properly. Trouble finding breast cancer if you're a female with dense breasts. What happens before the test? Have this test done about 1-2 weeks after your menstrual period. This is when your breasts are the least tender. If you're going to a new provider, have any past mammogram pictures sent to your new provider. Wash your breasts and under your arms on the day of the test. On the day of the test, do not wear: Deodorants. Perfumes. Lotions. Powders. Take off any jewelry from your neck. Wear clothes that you can change into and out of easily. What happens during the test?  You'll take off your clothes from the waist up. You'll put on a gown. You'll stand in front  of the X-ray machine. Each breast will be put between two plastic or glass plates by the person who runs the X-ray. The plates will press down on your breast for a few seconds. Try to relax. You may be asked to hold your breath. The pressing down won't cause any harm to your breasts. It may not feel comfortable, but it'll be very brief. X-rays will be taken from different angles of each breast. These steps may vary. Ask what you can expect. What can I expect after the test? The test will be read by a provider called a radiologist. You may need to do parts of the test again if the pictures aren't clear enough. Follow these instructions at home: You may go back to your normal activities right away. Ask when your test results will be ready and how to get them. You may need to call or meet with your provider to get the results. This information is not intended to replace advice given to you by your health care provider. Make sure you discuss any questions you have with your health care provider. Document Revised: 10/10/2023 Document Reviewed:  05/09/2023 Elsevier Patient Education  2024 [employer].

## 2024-08-08 NOTE — Progress Notes (Signed)
 Subjective:   Kim Hanson is a 68 y.o. female who presents for Medicare Annual (Subsequent) preventive examination.  Visit Complete: In person  Patient Medicare AWV questionnaire was completed by the patient on 08/08/24; I have confirmed that all information answered by patient is correct and no changes since this date.        Objective:    There were no vitals filed for this visit. There is no height or weight on file to calculate BMI.     03/16/2023   10:27 AM  Advanced Directives  Does Patient Have a Medical Advance Directive? Yes  Type of Estate agent of [place];Living will  Copy of Healthcare Power of Attorney in Chart? No - copy requested    Current Medications (verified) Outpatient Encounter Medications as of 08/08/2024  Medication Sig   rosuvastatin  (CRESTOR ) 5 MG tablet Take 1 tablet (5 mg total) by mouth daily.   Flaxseed, Linseed, (FLAX SEED OIL PO) Take by mouth.   MAGNESIUM PO Take by mouth.   Multiple Vitamin (MULTIVITAMIN) tablet Take 1 tablet by mouth daily.   risedronate  (ACTONEL ) 35 MG tablet TAKE 1 TABLET (35 MG TOTAL) BY MOUTH EVERY 7 (SEVEN) DAYS. WITH WATER ON EMPTY STOMACH, NOTHING BY MOUTH OR LIE DOWN FOR NEXT 30 MINUTES.   No facility-administered encounter medications on file as of 08/08/2024.    Allergies (verified) Patient has no known allergies.   History: No past medical history on file. No past surgical history on file. Family History  Problem Relation Age of Onset   Hyperlipidemia Brother    Breast cancer Neg Hx    Social History   Socioeconomic History   Marital status: Widowed    Spouse name: Not on file   Number of children: Not on file   Years of education: Not on file   Highest education level: Master's degree (e.g., MA, MS, MEng, MEd, MSW, MBA)  Occupational History   Not on file  Tobacco Use   Smoking status: Never   Smokeless tobacco: Never  Vaping Use   Vaping status: Never Used   Substance and Sexual Activity   Alcohol use: Yes    Comment: socially   Drug use: Never   Sexual activity: Yes    Birth control/protection: Post-menopausal  Other Topics Concern   Not on file  Social History Narrative   RN   Social Drivers of Health   Financial Resource Strain: Low Risk  (05/23/2024)   Overall Financial Resource Strain (CARDIA)    Difficulty of Paying Living Expenses: Not very hard  Food Insecurity: No Food Insecurity (05/23/2024)   Hunger Vital Sign    Worried About Running Out of Food in the Last Year: Never true    Ran Out of Food in the Last Year: Never true  Transportation Needs: No Transportation Needs (05/23/2024)   PRAPARE - Administrator, Civil Service (Medical): No    Lack of Transportation (Non-Medical): No  Physical Activity: Sufficiently Active (05/23/2024)   Exercise Vital Sign    Days of Exercise per Week: 7 days    Minutes of Exercise per Session: 40 min  Stress: Stress Concern Present (05/23/2024)   [employer] of Occupational Health - Occupational Stress Questionnaire    Feeling of Stress: To some extent  Social Connections: Moderately Integrated (05/23/2024)   Social Connection and Isolation Panel    Frequency of Communication with Friends and Family: More than three times a week    Frequency of Social  Gatherings with Friends and Family: More than three times a week    Attends Religious Services: More than 4 times per year    Active Member of Clubs or Organizations: Yes    Attends Banker Meetings: More than 4 times per year    Marital Status: Widowed    Tobacco Counseling Counseling given: Not Answered   Clinical Intake:                        Activities of Daily Living    08/05/2024    9:04 AM 05/30/2024    3:42 PM  In your present state of health, do you have any difficulty performing the following activities:  Hearing? 0 0  Vision? 0 0  Difficulty concentrating or making decisions? 0  0  Walking or climbing stairs? 0 0  Dressing or bathing? 0 0  Doing errands, shopping? 0 0  Preparing Food and eating ? N N  Using the Toilet? N N  In the past six months, have you accidently leaked urine? N N  Do you have problems with loss of bowel control? N N  Managing your Medications? N N  Managing your Finances? N N  Housekeeping or managing your Housekeeping? N N    Patient Care Team: Oley Bascom RAMAN, NP as PCP - General (Adult Health Nurse Practitioner) Jannis Kate Norris, MD as Consulting Physician (Obstetrics and Gynecology)  Indicate any recent Medical Services you may have received from other than [facility] providers in the past year (date may be approximate).     Assessment:   This is a routine wellness examination for Marieanne.  Hearing/Vision screen No results found.   Goals Addressed   None    Depression Screen    03/16/2023   10:23 AM 04/04/2022    9:18 AM  PHQ 2/9 Scores  PHQ - 2 Score 0 0    Fall Risk    08/05/2024    9:04 AM 05/30/2024    3:42 PM 05/29/2024    5:53 PM 05/21/2024   11:18 AM 03/26/2024    2:31 PM  Fall Risk   Falls in the past year? 0 0 0 0 0    MEDICARE RISK AT HOME: Medicare Risk at Home Any stairs in or around the home?: (Patient-Rptd) Yes If so, are there any without handrails?: (Patient-Rptd) No Home free of loose throw rugs in walkways, pet beds, electrical cords, etc?: (Patient-Rptd) Yes Adequate lighting in your home to reduce risk of falls?: (Patient-Rptd) Yes Life alert?: (Patient-Rptd) No Use of a cane, walker or w/c?: (Patient-Rptd) No Grab bars in the bathroom?: (Patient-Rptd) No Shower chair or bench in shower?: (Patient-Rptd) No Elevated toilet seat or a handicapped toilet?: (Patient-Rptd) No  TIMED UP AND GO:  Was the test performed?  Yes  Length of time to ambulate 10 feet: 3 sec Gait steady and fast without use of assistive device    Cognitive Function:        03/16/2023   10:27 AM  6CIT Screen  What  Year? 0 points  What month? 0 points  What time? 0 points  Count back from 20 0 points  Months in reverse 0 points  Repeat phrase 0 points  Total Score 0 points    Immunizations Immunization History  Administered Date(s) Administered   INFLUENZA, HIGH DOSE SEASONAL PF 09/19/2022   Influenza,inj,Quad PF,6+ Mos 08/18/2019   PFIZER(Purple Top)SARS-COV-2 Vaccination 02/03/2020, 02/24/2020   PNEUMOCOCCAL CONJUGATE-20 12/29/2021  Pfizer Covid-19 Vaccine Bivalent Booster 62yrs & up 11/04/2020, 09/02/2021   Pfizer Fall 2024 Covid-19 Vaccine 41yrs thru 18yrs. 09/12/2022   RSV,unspecified 09/26/2022   Tdap 12/12/2013, 08/21/2019   Zoster Recombinant(Shingrix) 04/29/2017, 10/11/2017   Zoster, Live 10/11/2016    TDAP status: Up to date  Flu Vaccine status: Up to date  Pneumococcal vaccine status: Up to date  Covid-19 vaccine status: Information provided on how to obtain vaccines.   Qualifies for Shingles Vaccine? Yes   Zostavax completed Yes   Shingrix Completed?: Yes  Screening Tests Health Maintenance  Topic Date Due   COVID-19 Vaccine (5 - 2024-25 season) 08/13/2023   Colonoscopy  12/13/2023   Medicare Annual Wellness (AWV)  03/15/2024   INFLUENZA VACCINE  07/12/2024   MAMMOGRAM  10/18/2025   DTaP/Tdap/Td (3 - Td or Tdap) 08/20/2029   Pneumococcal Vaccine: 50+ Years  Completed   DEXA SCAN  Completed   Hepatitis C Screening  Completed   Zoster Vaccines- Shingrix  Completed   HPV VACCINES  Aged Out   Meningococcal B Vaccine  Aged Out    Health Maintenance  Health Maintenance Due  Topic Date Due   COVID-19 Vaccine (5 - 2024-25 season) 08/13/2023   Colonoscopy  12/13/2023   Medicare Annual Wellness (AWV)  03/15/2024   INFLUENZA VACCINE  07/12/2024    Colon cancer screening test was preformed and mailed back in.  Mammogram status: Completed 10/19/2023. Repeat every year  Bone Density status: Completed 01/12/23. Results reflect: Bone density results: NORMAL. Repeat  every 2 years.  Lung Cancer Screening: (Low Dose CT Chest recommended if Age 10-80 years, 20 pack-year currently smoking OR have quit w/in 15years.) does not qualify.   Lung Cancer Screening Referral: N/A  Additional Screening:  Hepatitis C Screening: does not qualify; Completed 10/05/22  Vision Screening: Recommended annual ophthalmology exams for early detection of glaucoma and other disorders of the eye. Is the patient up to date with their annual eye exam?  Yes  Who is the provider or what is the name of the office in which the patient attends annual eye exams? [facility] If pt is not established with a provider, would they like to be referred to a provider to establish care? Yes .   Dental Screening: Recommended annual dental exams for proper oral hygiene  Diabetic Foot Exam: N/A  Community Resource Referral / Chronic Care Management: CRR required this visit?  No   CCM required this visit?  No     Plan:     I have personally reviewed and noted the following in the patient's chart:   Medical and social history Use of alcohol, tobacco or illicit drugs  Current medications and supplements including opioid prescriptions. Patient is not currently taking opioid prescriptions. Functional ability and status Nutritional status Physical activity Advanced directives List of other physicians Hospitalizations, surgeries, and ER visits in previous 12 months Vitals Screenings to include cognitive, depression, and falls Referrals and appointments  In addition, I have reviewed and discussed with patient certain preventive protocols, quality metrics, and best practice recommendations. A written personalized care plan for preventive services as well as general preventive health recommendations were provided to patient.     Suzen Shove, RMA   08/08/2024   After Visit Summary: (In Person-Printed) AVS printed and given to the patient  Nurse Notes: Thank you for your  time.  Suzen Shove   CMA II

## 2024-09-19 ENCOUNTER — Telehealth: Payer: Self-pay | Admitting: Nurse Practitioner

## 2024-09-19 NOTE — Telephone Encounter (Unsigned)
 Copied from CRM [id number]. Topic: General - Billing Inquiry >> Sep 19, 2024  1:24 PM Tiffini S wrote: Reason for CRM: Patient called about a statement she had receive statement for 08/08/24- the patient was suppose to receive a gift card for doing the AWV with [employer]- the office has to confirm this  Please call the patient at [phone] for an update

## 2024-10-16 ENCOUNTER — Encounter

## 2024-10-16 DIAGNOSIS — Z1231 Encounter for screening mammogram for malignant neoplasm of breast: Secondary | ICD-10-CM

## 2024-10-21 ENCOUNTER — Other Ambulatory Visit: Payer: Self-pay | Admitting: Nurse Practitioner

## 2024-10-21 DIAGNOSIS — Z1231 Encounter for screening mammogram for malignant neoplasm of breast: Secondary | ICD-10-CM

## 2024-10-24 ENCOUNTER — Inpatient Hospital Stay: Admission: RE | Admit: 2024-10-24 | Discharge: 2024-10-24 | Payer: Medicare Other

## 2024-10-24 DIAGNOSIS — Z1231 Encounter for screening mammogram for malignant neoplasm of breast: Secondary | ICD-10-CM

## 2025-08-14 ENCOUNTER — Ambulatory Visit: Payer: Self-pay
